# Patient Record
Sex: Female | Born: 1990 | Race: Black or African American | Hispanic: No | Marital: Single | Smoking: Current some day smoker
Health system: Southern US, Community
[De-identification: ages and names within clinical notes are randomized; demographics above are authoritative.]

## PROBLEM LIST (undated history)

## (undated) DIAGNOSIS — Z789 Other specified health status: Secondary | ICD-10-CM

## (undated) DIAGNOSIS — Z3490 Encounter for supervision of normal pregnancy, unspecified, unspecified trimester: Secondary | ICD-10-CM

## (undated) HISTORY — DX: Other specified health status: Z78.9

## (undated) HISTORY — PX: ADENOIDECTOMY: SUR15

---
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
## 2013-11-04 LAB — HM PAP SMEAR

## 2014-10-16 ENCOUNTER — Encounter: Payer: Self-pay | Admitting: Emergency Medicine

## 2014-10-16 ENCOUNTER — Ambulatory Visit
Admission: EM | Admit: 2014-10-16 | Discharge: 2014-10-16 | Disposition: A | Discharge status: Home / Self Care | Payer: 59 | Attending: Internal Medicine | Admitting: Internal Medicine

## 2014-10-16 DIAGNOSIS — B084 Enteroviral vesicular stomatitis with exanthem: Secondary | ICD-10-CM

## 2014-10-16 HISTORY — DX: Encounter for supervision of normal pregnancy, unspecified, unspecified trimester: Z34.90

## 2014-10-16 NOTE — ED Notes (Signed)
Pt has rash on hands and feet, and her 23 year old son just had Hand,foot and mouth.

## 2014-10-16 NOTE — ED Notes (Signed)
Pt is female, need to correct chart. 21/2 -3 months pregnant. LMP July 17, 2014.

## 2014-10-16 NOTE — Discharge Instructions (Signed)
Tylenol  325 mg  Take 2 every 6 hours as needed or alternate with 2 Ibuprofen 200 mg every 6 hours  Zantac 150 mg twice daily for 3-5 days  Zyrtec 10 mg twice daily for 3-5 days  Aveeno [employer] if you wish  Benadryl 25 mg 1 or 2 tablets at bedtime for sleep   Hand, Foot, and Mouth Disease Hand, foot, and mouth disease is a common viral illness. It occurs mainly in children younger than 23 years of age, but adolescents and adults may also get it. This disease is different than foot and mouth disease that cattle, sheep, and pigs get. Most people are better in 1 week. CAUSES  Hand, foot, and mouth disease is usually caused by a group of viruses called enteroviruses. Hand, foot, and mouth disease can spread from person to person (contagious). A person is most contagious during the first week of the illness. It is not transmitted to or from pets or other animals. It is most common in the summer and early fall. Infection is spread from person to person by direct contact with an infected person's:  Nose discharge.  Throat discharge.  Stool. SYMPTOMS  Open sores (ulcers) occur in the mouth. Symptoms may also include:  A rash on the hands and feet, and occasionally the buttocks.  Fever.  Aches.  Pain from the mouth ulcers.  Fussiness. DIAGNOSIS  Hand, foot, and mouth disease is one of many infections that cause mouth sores. To be certain your child has hand, foot, and mouth disease your caregiver will diagnose your child by physical exam.Additional tests are not usually needed. TREATMENT  Nearly all patients recover without medical treatment in 7 to 10 days. There are no common complications. Your child should only take over-the-counter or prescription medicines for pain, discomfort, or fever as directed by your caregiver. Your caregiver may recommend the use of an over-the-counter antacid or a combination of an antacid and diphenhydramine to help coat the lesions in the mouth and  improve symptoms.  HOME CARE INSTRUCTIONS  Try combinations of foods to see what your child will tolerate and aim for a balanced diet. Soft foods may be easier to swallow. The mouth sores from hand, foot, and mouth disease typically hurt and are painful when exposed to salty, spicy, or acidic food or drinks.  Milk and cold drinks are soothing for some patients. Milk shakes, frozen ice pops, slushies, and sherberts are usually well tolerated.  Sport drinks are good choices for hydration, and they also provide a few calories. Often, a child with hand, foot, and mouth disease will be able to drink without discomfort.   For younger children and infants, feeding with a cup, spoon, or syringe may be less painful than drinking through the nipple of a bottle.  Keep children out of childcare programs, schools, or other group settings during the first few days of the illness or until they are without fever. The sores on the body are not contagious. SEEK IMMEDIATE MEDICAL CARE IF:  Your child develops signs of dehydration such as:  Decreased urination.  Dry mouth, tongue, or lips.  Decreased tears or sunken eyes.  Dry skin.  Rapid breathing.  Fussy behavior.  Poor color or pale skin.  Fingertips taking longer than 2 seconds to turn pink after a gentle squeeze.  Rapid weight loss.  Your child does not have adequate pain relief.  Your child develops a severe headache, stiff neck, or change in behavior.  Your child  develops ulcers or blisters that occur on the lips or outside of the mouth. Document Released: 12/07/2002 Document Revised: 06/02/2011 Document Reviewed: 08/22/2010 [facility] Patient Information 2015 [place], [place]. This information is not intended to replace advice given to you by your health care provider. Make sure you discuss any questions you have with your health care provider.

## 2014-10-19 ENCOUNTER — Emergency Department: Payer: 59

## 2014-10-19 ENCOUNTER — Emergency Department
Admission: EM | Admit: 2014-10-19 | Discharge: 2014-10-19 | Disposition: A | Discharge status: Home / Self Care | Payer: 59 | Attending: Emergency Medicine | Admitting: Emergency Medicine

## 2014-10-19 ENCOUNTER — Encounter: Payer: Self-pay | Admitting: Physician Assistant

## 2014-10-19 DIAGNOSIS — O200 Threatened abortion: Secondary | ICD-10-CM | POA: Insufficient documentation

## 2014-10-19 DIAGNOSIS — Z87891 Personal history of nicotine dependence: Secondary | ICD-10-CM | POA: Insufficient documentation

## 2014-10-19 DIAGNOSIS — N939 Abnormal uterine and vaginal bleeding, unspecified: Secondary | ICD-10-CM

## 2014-10-19 DIAGNOSIS — O209 Hemorrhage in early pregnancy, unspecified: Secondary | ICD-10-CM | POA: Diagnosis present

## 2014-10-19 DIAGNOSIS — Z3A12 12 weeks gestation of pregnancy: Secondary | ICD-10-CM | POA: Insufficient documentation

## 2014-10-19 LAB — CBC
HCT: 41 % (ref 35.0–47.0)
HEMOGLOBIN: 13.7 g/dL (ref 12.0–16.0)
MCH: 32.9 pg (ref 26.0–34.0)
MCHC: 33.5 g/dL (ref 32.0–36.0)
MCV: 98.1 fL (ref 80.0–100.0)
Platelets: 177 10*3/uL (ref 150–440)
RBC: 4.18 MIL/uL (ref 3.80–5.20)
RDW: 13.5 % (ref 11.5–14.5)
WBC: 13.3 10*3/uL — ABNORMAL HIGH (ref 3.6–11.0)

## 2014-10-19 LAB — BASIC METABOLIC PANEL
ANION GAP: 10 (ref 5–15)
BUN: 9 mg/dL (ref 6–20)
CHLORIDE: 105 mmol/L (ref 101–111)
CO2: 22 mmol/L (ref 22–32)
Calcium: 8.7 mg/dL — ABNORMAL LOW (ref 8.9–10.3)
Creatinine, Ser: 0.58 mg/dL (ref 0.44–1.00)
GFR calc Af Amer: >60 mL/min (ref >60)
GLUCOSE: 98 mg/dL (ref 65–99)
Potassium: 3.4 mmol/L — ABNORMAL LOW (ref 3.5–5.1)
Sodium: 137 mmol/L (ref 135–145)

## 2014-10-19 LAB — HCG, QUANTITATIVE, PREGNANCY: HCG, BETA CHAIN, QUANT, S: 6214 m[IU]/mL — AB (ref <5)

## 2014-10-19 NOTE — ED Notes (Signed)
Patient is about 10-[redacted] weeks pregnant per her guess. Began to spot this AM and bleeding increased this afternoon. Abdominal cramping began yesterday afternoon. This is her third pregnancy, had abortion with second pregnancy. Live birth with the first.

## 2014-10-19 NOTE — ED Provider Notes (Signed)
[facility] Emergency Department Provider Note    ____________________________________________  Time seen: 1930  I have reviewed the triage vital signs and the nursing notes.   HISTORY  Chief Complaint Vaginal Bleeding   History limited by: Not Limited   HPI Julie Harvey is a 24 y.o. female who presents to the emergency department today because of concerns for vaginal bleeding and pelvic pain. Patient states she thinks she is roughly 10-[redacted] weeks pregnant. She has not seen an OB/GYN doctor yet. She states she started having some pelvic pain yesterday. Today she noticed increasing pelvic pain as well as bloody discharge. She does feel like she passed at least one clot this afternoon. The patient denies any fevers, nausea or vomiting.     Past Medical History  Diagnosis Date  . Pregnancy     There are no active problems to display for this patient.   Past Surgical History  Procedure Laterality Date  . Adenoidectomy      No current outpatient prescriptions on file.  Allergies Review of patient's allergies indicates no known allergies.  No family history on file.  Social History History  Substance Use Topics  . Smoking status: Former Games developer  . Smokeless tobacco: Not on file  . Alcohol Use: No    Review of Systems  Constitutional: Negative for fever. Cardiovascular: Negative for chest pain. Respiratory: Negative for shortness of breath. Gastrointestinal: Positive for pelvic pain Genitourinary: Negative for dysuria. Musculoskeletal: Negative for back pain. Skin: Negative for rash. Neurological: Negative for headaches, focal weakness or numbness.   10-point ROS otherwise negative.  ____________________________________________   PHYSICAL EXAM:  VITAL SIGNS: ED Triage Vitals  Enc Vitals Group     BP 10/19/14 1748 106/71 mmHg     Pulse Rate 10/19/14 1748 104     Resp 10/19/14 1748 18     Temp 10/19/14 1748 98.7 F (37.1 C)      Temp Source 10/19/14 1748 Oral     SpO2 10/19/14 1748 100 %     Weight 10/19/14 1748 137 lb (62.143 kg)     Height 10/19/14 1748  (1.6 m)     Head Cir --      Peak Flow --      Pain Score 10/19/14 1748 6   Constitutional: Alert and oriented. Well appearing and in no distress. Eyes: Conjunctivae are normal. PERRL. Normal extraocular movements. ENT   Head: Normocephalic and atraumatic.   Nose: No congestion/rhinnorhea.   Mouth/Throat: Mucous membranes are moist.   Neck: No stridor. Hematological/Lymphatic/Immunilogical: No cervical lymphadenopathy. Cardiovascular: Normal rate, regular rhythm.  No murmurs, rubs, or gallops. Respiratory: Normal respiratory effort without tachypnea nor retractions. Breath sounds are clear and equal bilaterally. No wheezes/rales/rhonchi. Gastrointestinal: Soft and nontender. No distention. There is no CVA tenderness. Genitourinary: Deferred Musculoskeletal: Normal range of motion in all extremities. No joint effusions.  No lower extremity tenderness nor edema. Neurologic:  Normal speech and language. No gross focal neurologic deficits are appreciated. Speech is normal.  Skin:  Skin is warm, dry and intact. No rash noted. Psychiatric: Mood and affect are normal. Speech and behavior are normal. Patient exhibits appropriate insight and judgment.  ____________________________________________    LABS (pertinent positives/negatives)  Labs Reviewed  HCG, QUANTITATIVE, PREGNANCY - Abnormal; Notable for the following:    hCG, Beta Chain, Quant, S 6214 (*)    All other components within normal limits  CBC - Abnormal; Notable for the following:    WBC 13.3 (*)  All other components within normal limits  BASIC METABOLIC PANEL - Abnormal; Notable for the following:    Potassium 3.4 (*)    Calcium 8.7 (*)    All other components within normal limits  ABO/RH      ____________________________________________   EKG  None  ____________________________________________    RADIOLOGY  Transvaginal ultrasound IMPRESSION: Complex irregular fluid collection in the endometrial cavity which could represent a gestational sac but there is no yolk sac or fetal pole. ____________________________________________   PROCEDURES  Procedure(s) performed: None  Critical Care performed: No  ____________________________________________   INITIAL IMPRESSION / ASSESSMENT AND PLAN / ED COURSE  Pertinent labs & imaging results that were available during my care of the patient were reviewed by me and considered in my medical decision making (see chart for details).  Patient presents to the emergency department with concerns for first trimester vaginal bleeding. Ultrasound shows a complex fluid collection in the uterus. I did discuss with Dr. Vergie Living with OB/GYN who recommended the patient call the clinic tomorrow morning. I discussed with patient importance of following up with Ob/gyn and prenatal vitamins. Additionally the patient did decline pelvic exam and chose to defer it til she can see Ob/gyn.  ____________________________________________   FINAL CLINICAL IMPRESSION(S) / ED DIAGNOSES  Final diagnoses:  Vaginal bleeding  Threatened abortion     Phineas Semen, MD 10/19/14 [phone]

## 2014-10-19 NOTE — Discharge Instructions (Signed)
As discussed you will need a recheck of your blood work (beta HCG) in 48 hours. Additionally it is very important that you follow up with the ob/gyn doctor. Please seek medical attention for any high fevers, chest pain, shortness of breath, change in behavior, persistent vomiting, bloody stool or any other new or concerning symptoms.  Threatened Miscarriage A threatened miscarriage occurs when you have vaginal bleeding during your first 20 weeks of pregnancy but the pregnancy has not ended. If you have vaginal bleeding during this time, your health care provider will do tests to make sure you are still pregnant. If the tests show you are still pregnant and the developing baby (fetus) inside your womb (uterus) is still growing, your condition is considered a threatened miscarriage. A threatened miscarriage does not mean your pregnancy will end, but it does increase the risk of losing your pregnancy (complete miscarriage). CAUSES  The cause of a threatened miscarriage is usually not known. If you go on to have a complete miscarriage, the most common cause is an abnormal number of chromosomes in the developing baby. Chromosomes are the structures inside cells that hold all your genetic material. Some causes of vaginal bleeding that do not result in miscarriage include:  Having sex.  Having an infection.  Normal hormone changes of pregnancy.  Bleeding that occurs when an egg implants in your uterus. RISK FACTORS Risk factors for bleeding in early pregnancy include:  Obesity.  Smoking.  Drinking excessive amounts of alcohol or caffeine.  Recreational drug use. SIGNS AND SYMPTOMS  Light vaginal bleeding.  Mild abdominal pain or cramps. DIAGNOSIS  If you have bleeding with or without abdominal pain before 20 weeks of pregnancy, your health care provider will do tests to check whether you are still pregnant. One important test involves using sound waves and a computer (ultrasound) to create  images of the inside of your uterus. Other tests include an internal exam of your vagina and uterus (pelvic exam) and measurement of your baby's heart rate.  You may be diagnosed with a threatened miscarriage if:  Ultrasound testing shows you are still pregnant.  Your baby's heart rate is strong.  A pelvic exam shows that the opening between your uterus and your vagina (cervix) is closed.  Your heart rate and blood pressure are stable.  Blood tests confirm you are still pregnant. TREATMENT  No treatments have been shown to prevent a threatened miscarriage from going on to a complete miscarriage. However, the right home care is important.  HOME CARE INSTRUCTIONS   Make sure you keep all your appointments for prenatal care. This is very important.  Get plenty of rest.  Do not have sex or use tampons if you have vaginal bleeding.  Do not douche.  Do not smoke or use recreational drugs.  Do not drink alcohol.  Avoid caffeine. SEEK MEDICAL CARE IF:  You have light vaginal bleeding or spotting while pregnant.  You have abdominal pain or cramping.  You have a fever. SEEK IMMEDIATE MEDICAL CARE IF:  You have heavy vaginal bleeding.  You have blood clots coming from your vagina.  You have severe low back pain or abdominal cramps.  You have fever, chills, and severe abdominal pain. MAKE SURE YOU:  Understand these instructions.  Will watch your condition.  Will get help right away if you are not doing well or get worse. Document Released: 03/10/2005 Document Revised: 03/15/2013 Document Reviewed: 01/04/2013 [facility] Patient Information 2015 [place], [place]. This information is not intended  to replace advice given to you by your health care provider. Make sure you discuss any questions you have with your health care provider. ° °

## 2014-10-19 NOTE — ED Notes (Signed)
Pt c/o lower abdominal cramping and light vaginal bleeding. Pt approx [redacted] weeks pregnant. Pt has not been to OBGYN for appt yet. Pt alert and oriented X4, active, cooperative, pt in NAD. RR even and unlabored, color WNL.

## 2014-10-19 NOTE — ED Provider Notes (Signed)
CSN: [id number]     Arrival date & time 10/16/14  1136 History   First MD Initiated Contact with Patient 10/16/14 1311     Chief Complaint  Patient presents with  . Rash   (Consider location/radiation/quality/duration/timing/severity/associated sxs/prior Treatment) HPI 24 yo F with mild malaise. New onset palm and sole rash/blisters. No oral lesions reported. Young 2 yo son had Hand/foot /mouth diagnosed a week ago, he had significant oral lesions as well as hand/foot by her report-his symptoms have resolved. She is having itching making it difficult to rest- reports LMP 4/25 and about 8-10 week preg - has OB appointment later this week. Has used Benadryl At bedtime to sleep. Feels tired.  Past Medical History  Diagnosis Date  . Pregnancy    Past Surgical History  Procedure Laterality Date  . Adenoidectomy     History reviewed. No pertinent family history. History  Substance Use Topics  . Smoking status: Former Games developer  . Smokeless tobacco: Not on file  . Alcohol Use: No   OB History    No data available     Review of Systems Constitutional -afebrile Eyes-denies visual changes ENT- normal voice,denies sore throat, no oral lesions CV-denies chest pain Resp-denies SOB GI- negative for nausea,vomiting, diarrhea GU- negative for dysuria MSK- negative for back pain, ambulatory Skin- see HPI, has new onset red rash/blisters hands and feet, Neuro- negative headache,focal weakness or numbness    Allergies  Review of patient's allergies indicates no known allergies.  Home Medications   Prior to Admission medications   Not on File   BP 101/56 mmHg  Pulse 94  Temp(Src) 98.2 F (36.8 C) (Oral)  Resp 16  Ht  (1.6 m) Physical Exam   Constitutional -alert and oriented,well appearing and in mild distress rash, mild fatigue Head-atraumatic, normocephalic Eyes- conjunctiva normal, EOMI ,conjugate gaze Nose- no congestion or rhinorrhea Mouth/throat- mucous membranes  moist ,oropharynx non-erythematous- no blisters Neck- supple without glandular enlargement CV- regular rate, grossly normal heart sounds,  Resp-no distress, normal respiratory effort,clear to auscultation bilaterally GI- soft,non-tender,no distention GU-  Not examined MSK- no tender, normal ROM, all extremities, ambulatory, self-care Neuro- normal speech and language, no gross focal neurological deficit appreciated, no gait instability, Skin-palms and soles present with outbreak individual erythematous  lesions, some islands of same- pruritis reported by patient, some blisters Psych-mood and affect grossly normal; speech and behavior grossly normal ED Course  Procedures (including critical care time) Labs Review Labs Reviewed - No data to display  Imaging Review No results found.   MDM   1. Hand, foot and mouth disease    Diagnosis and treatment discussed. Cautioned her to use a minimum of medication given her early pregnancy status and to discuss with Ob-Gyn at her pending follow up .Aveeno Oatmeal Colloid Bath if desired. Encouraged to increase her fluid intake. Questions fielded, expectations and recommendations reviewed. Patient expresses understanding. [facility] to [facility] with questions, concern or exacerbation.     Rae Halsted, PA-C 10/19/14 1335

## 2014-10-20 LAB — ABO/RH: ABO/RH(D): O POS

## 2014-10-21 LAB — ABO/RH: ABO/RH(D): O POS

## 2014-10-26 ENCOUNTER — Encounter
Admission: RE | Disposition: A | Discharge status: Home / Self Care | Payer: Self-pay | Source: Ambulatory Visit | Attending: Obstetrics and Gynecology

## 2014-10-26 ENCOUNTER — Ambulatory Visit: Payer: 59 | Admitting: Certified Registered Nurse Anesthetist

## 2014-10-26 ENCOUNTER — Ambulatory Visit
Admission: RE | Admit: 2014-10-26 | Discharge: 2014-10-26 | Disposition: A | Discharge status: Home / Self Care | Payer: 59 | Source: Ambulatory Visit | Attending: Obstetrics and Gynecology | Admitting: Obstetrics and Gynecology

## 2014-10-26 ENCOUNTER — Encounter: Payer: Self-pay | Admitting: *Deleted

## 2014-10-26 DIAGNOSIS — O034 Incomplete spontaneous abortion without complication: Secondary | ICD-10-CM | POA: Diagnosis present

## 2014-10-26 DIAGNOSIS — N9972 Accidental puncture and laceration of a genitourinary system organ or structure during other procedure: Secondary | ICD-10-CM | POA: Insufficient documentation

## 2014-10-26 DIAGNOSIS — Y92234 Operating room of hospital as the place of occurrence of the external cause: Secondary | ICD-10-CM | POA: Insufficient documentation

## 2014-10-26 DIAGNOSIS — Y838 Other surgical procedures as the cause of abnormal reaction of the patient, or of later complication, without mention of misadventure at the time of the procedure: Secondary | ICD-10-CM | POA: Insufficient documentation

## 2014-10-26 DIAGNOSIS — Z87891 Personal history of nicotine dependence: Secondary | ICD-10-CM | POA: Insufficient documentation

## 2014-10-26 HISTORY — PX: DILATION AND EVACUATION: SHX1459

## 2014-10-26 LAB — TYPE AND SCREEN
ABO/RH(D): O POS
Antibody Screen: NEGATIVE

## 2014-10-26 LAB — CBC WITH DIFFERENTIAL/PLATELET
Basophils Absolute: 0.1 10*3/uL (ref 0–0.1)
Basophils Relative: 1 %
EOS ABS: 0.1 10*3/uL (ref 0–0.7)
Eosinophils Relative: 1 %
HCT: 32.2 % — ABNORMAL LOW (ref 35.0–47.0)
HEMOGLOBIN: 10.5 g/dL — AB (ref 12.0–16.0)
LYMPHS ABS: 1.8 10*3/uL (ref 1.0–3.6)
LYMPHS PCT: 15 %
MCH: 32.1 pg (ref 26.0–34.0)
MCHC: 32.7 g/dL (ref 32.0–36.0)
MCV: 98.2 fL (ref 80.0–100.0)
MONO ABS: 0.8 10*3/uL (ref 0.2–0.9)
Monocytes Relative: 7 %
NEUTROS ABS: 9.3 10*3/uL — AB (ref 1.4–6.5)
Neutrophils Relative %: 76 %
Platelets: 154 10*3/uL (ref 150–440)
RBC: 3.27 MIL/uL — AB (ref 3.80–5.20)
RDW: 13 % (ref 11.5–14.5)
WBC: 12.1 10*3/uL — AB (ref 3.6–11.0)

## 2014-10-26 SURGERY — DILATION AND EVACUATION, UTERUS
Anesthesia: Choice

## 2014-10-26 MED ORDER — MIDAZOLAM HCL 2 MG/2ML IJ SOLN
INTRAMUSCULAR | Status: DC | PRN
  Administered 2014-10-26: 2 mg via INTRAVENOUS

## 2014-10-26 MED ORDER — LACTATED RINGERS IV SOLN
INTRAVENOUS | Status: DC
  Administered 2014-10-26 (×2): via INTRAVENOUS

## 2014-10-26 MED ORDER — LIDOCAINE HCL (CARDIAC) 20 MG/ML IV SOLN
INTRAVENOUS | Status: DC | PRN
  Administered 2014-10-26: 100 mg via INTRAVENOUS

## 2014-10-26 MED ORDER — GLYCOPYRROLATE 0.2 MG/ML IJ SOLN
INTRAMUSCULAR | Status: DC | PRN
  Administered 2014-10-26: 0.2 mg via INTRAVENOUS

## 2014-10-26 MED ORDER — HYDROMORPHONE HCL 1 MG/ML IJ SOLN
INTRAMUSCULAR | Status: DC | PRN
  Administered 2014-10-26: 1 mg via INTRAVENOUS

## 2014-10-26 MED ORDER — FENTANYL CITRATE (PF) 100 MCG/2ML IJ SOLN: 25.0000 ug | INTRAMUSCULAR | Status: DC | PRN

## 2014-10-26 MED ORDER — FAMOTIDINE 20 MG PO TABS
20.0000 mg | ORAL_TABLET | Freq: Once | ORAL | Status: AC
  Administered 2014-10-26: 20 mg via ORAL

## 2014-10-26 MED ORDER — OXYCODONE-ACETAMINOPHEN 5-325 MG PO TABS
1.0000 | ORAL_TABLET | Freq: Four times a day (QID) | ORAL | Status: DC | PRN
  Administered 2014-10-26: 1 via ORAL

## 2014-10-26 MED ORDER — OXYCODONE-ACETAMINOPHEN 5-325 MG PO TABS: 1.0000 | ORAL_TABLET | Freq: Four times a day (QID) | ORAL | Status: DC | PRN

## 2014-10-26 MED ORDER — LIDOCAINE HCL (PF) 1 % IJ SOLN
INTRAMUSCULAR | Status: AC
  Filled 2014-10-26: qty 30

## 2014-10-26 MED ORDER — METHYLERGONOVINE MALEATE 0.2 MG PO TABS: 0.2000 mg | ORAL_TABLET | Freq: Four times a day (QID) | ORAL | Status: AC

## 2014-10-26 MED ORDER — PHENYLEPHRINE HCL 10 MG/ML IJ SOLN
INTRAMUSCULAR | Status: DC | PRN
  Administered 2014-10-26: 100 ug via INTRAVENOUS

## 2014-10-26 MED ORDER — EPHEDRINE SULFATE 50 MG/ML IJ SOLN
INTRAMUSCULAR | Status: DC | PRN
  Administered 2014-10-26: 10 mg via INTRAVENOUS

## 2014-10-26 MED ORDER — LIDOCAINE HCL 1 % IJ SOLN
INTRAMUSCULAR | Status: DC | PRN
  Administered 2014-10-26: 20 mL

## 2014-10-26 MED ORDER — DOXYCYCLINE HYCLATE 100 MG IV SOLR
100.0000 mg | Freq: Once | INTRAVENOUS | Status: AC
  Administered 2014-10-26 (×2): 100 mg via INTRAVENOUS
  Filled 2014-10-26 (×2): qty 100

## 2014-10-26 MED ORDER — ONDANSETRON HCL 4 MG/2ML IJ SOLN
INTRAMUSCULAR | Status: DC | PRN
  Administered 2014-10-26: 4 mg via INTRAVENOUS

## 2014-10-26 MED ORDER — DOCUSATE SODIUM 100 MG PO CAPS: 100.0000 mg | ORAL_CAPSULE | Freq: Two times a day (BID) | ORAL | Status: DC

## 2014-10-26 MED ORDER — OXYCODONE-ACETAMINOPHEN 5-325 MG PO TABS
ORAL_TABLET | ORAL | Status: AC
  Filled 2014-10-26: qty 1

## 2014-10-26 MED ORDER — FAMOTIDINE 20 MG PO TABS
ORAL_TABLET | ORAL | Status: AC
  Administered 2014-10-26: 20 mg via ORAL
  Filled 2014-10-26: qty 1

## 2014-10-26 MED ORDER — PROPOFOL 10 MG/ML IV BOLUS
INTRAVENOUS | Status: DC | PRN
  Administered 2014-10-26: 150 mg via INTRAVENOUS

## 2014-10-26 MED ORDER — DEXAMETHASONE SODIUM PHOSPHATE 4 MG/ML IJ SOLN
INTRAMUSCULAR | Status: DC | PRN
  Administered 2014-10-26: 5 mg via INTRAVENOUS

## 2014-10-26 MED ORDER — ONDANSETRON HCL 4 MG/2ML IJ SOLN: 4.0000 mg | Freq: Once | INTRAMUSCULAR | Status: DC | PRN

## 2014-10-26 MED ORDER — FERROUS GLUCONATE 324 (38 FE) MG PO TABS: 324.0000 mg | ORAL_TABLET | Freq: Two times a day (BID) | ORAL | Status: DC

## 2014-10-26 MED ORDER — LIDOCAINE HCL 2 % IJ SOLN: 0.1000 mL | Freq: Once | INTRAMUSCULAR | Status: DC

## 2014-10-26 MED ORDER — FENTANYL CITRATE (PF) 100 MCG/2ML IJ SOLN
INTRAMUSCULAR | Status: DC | PRN
  Administered 2014-10-26: 100 ug via INTRAVENOUS

## 2014-10-26 SURGICAL SUPPLY — 26 items
BLANKET WARM FULL BOD BAIR (MISCELLANEOUS) ×3 IMPLANT
CATH ROBINSON RED A/P 16FR (CATHETERS) ×3 IMPLANT
CUP MEDICINE 2OZ PLAST GRAD ST (MISCELLANEOUS) ×6 IMPLANT
DRSG TELFA 3X8 NADH (GAUZE/BANDAGES/DRESSINGS) ×6 IMPLANT
GLOVE BIO SURGEON STRL SZ7 (GLOVE) ×9 IMPLANT
GLOVE BIO SURGEON STRL SZ8 (GLOVE) ×3 IMPLANT
GOWN STRL REUS W/ TWL LRG LVL3 (GOWN DISPOSABLE) ×1 IMPLANT
GOWN STRL REUS W/ TWL XL LVL3 (GOWN DISPOSABLE) ×1 IMPLANT
GOWN STRL REUS W/TWL LRG LVL3 (GOWN DISPOSABLE) ×2
GOWN STRL REUS W/TWL XL LVL3 (GOWN DISPOSABLE) ×2
KIT BERKELEY 1ST TRIMESTER 3/8 (MISCELLANEOUS) ×3 IMPLANT
KIT RM TURNOVER CYSTO AR (KITS) ×3 IMPLANT
NEEDLE SPNL 22GX3.5 QUINCKE BK (NEEDLE) ×6 IMPLANT
NS IRRIG 500ML POUR BTL (IV SOLUTION) ×3 IMPLANT
PAD OB MATERNITY 4.3X12.25 (PERSONAL CARE ITEMS) ×3 IMPLANT
PAD PREP 24X41 OB/GYN DISP (PERSONAL CARE ITEMS) ×3 IMPLANT
SET BERKELEY SUCTION TUBING (SUCTIONS) ×3 IMPLANT
SPONGE XRAY 4X4 16PLY STRL (MISCELLANEOUS) ×3 IMPLANT
SUT VIC AB 1 CT1 36 (SUTURE) ×15 IMPLANT
SYR CONTROL 10ML (SYRINGE) ×3 IMPLANT
TOWEL OR 17X26 4PK STRL BLUE (TOWEL DISPOSABLE) ×3 IMPLANT
VACURETTE 10 RIGID CVD (CANNULA) ×3 IMPLANT
VACURETTE 12 RIGID CVD (CANNULA) IMPLANT
VACURETTE 7MM F TIP (CANNULA)
VACURETTE 7MM F TIP STRL (CANNULA) IMPLANT
VACURETTE 8 RIGID CVD (CANNULA) ×3 IMPLANT

## 2014-10-26 NOTE — Op Note (Signed)
Operative Note   10/26/2014  PRE-OP DIAGNOSIS: incomplete abortion   POST-OP DIAGNOSIS: Same. Cervical laceration   SURGEON: Surgeon(s) and Role:    * Cabot Bing, MD - Primary  ASSISTANT: none  PROCEDURE: suction dilation and curettage, repair of cervical laceration  ANESTHESIA: General and local  ESTIMATED BLOOD LOSS:  DRAINS: I/O foley at the end of the case ( UOP)   TOTAL IV FLUIDS: crystalloid  SPECIMENS: products of conception  VTE PROPHYLAXIS: TED hoses to the bilateral lower extremities  ANTIBIOTICS: Doxycycline  IV x1 pre op  COMPLICATIONS: 3-4 o'clock cervical laceration  DISPOSITION: PACU - hemodynamically stable.  CONDITION: stable  BLOOD TYPE: O positive  FINDINGS: Normal EGBUS, vagina and cervix with 10mL old blood in the vault.  Necrotic appearing POCs seen. Pumping arterial blood vessel seen in the LLQ of the cervix.  PROCEDURE IN DETAIL:  After informed consent was obtained, the patient was taken to the operating room where anesthesia was obtained without difficulty. The patient was positioned in the dorsal lithotomy position in [place] stirrups.   The patient was examined under anesthesia, with the above noted findings.  The bi-valved speculum was placed inside the patient's vagina, and the the anterior lip of the cervix was seen and grasped with the tenaculum.  A paracervical block was achieved with 20mL of 1% lidocaine. The cervix was already dilated and easily passed a #8 suction cannula and the suction D&C was done, after confirming suction of , prior to the start of the case. During the [facility], it was noticed that there was brisk bleeding and a likely cervical laceration in the area of the LLQ of the cervix, after 1-0 vicryl figure of eight sutures were placed at 7-8 and 6 o'clock. Another stitch was placed at 3-4 o'clock, in a figure of eight fashion, at the pumping arterial vessel and hemostasis was noted; during the above  stitches, care was made to incorporate only the ipsilateral cervix and to keep the canal patent.  The suction cannula was then removed from the machine and placed at the uterine fundus and then gently removed from the uterus and cervix with no bleeding noted at any of the levels.  The suction D&C and then manual curettage was done, until gritty texture was noted in all four quadrants, excellent hemostasis seen and the cannula test was again repeated and no bleeding noted at any level, after prophylactic bimanual massage was done. The patient's bladder was catheterized with an in and out foley catheter.  She was then taken out of dorsal lithotomy, the patient tolerated the procedure well, and sponge, lap and needle counts were correct x2.  The patient was taken to recovery room in excellent condition.  Cornelia Copa MD [facility] OBGYN Pager [phone]

## 2014-10-26 NOTE — Anesthesia Procedure Notes (Signed)
Procedure Name: LMA Insertion Date/Time: 10/26/2014 2:23 PM Performed by: Shirlee Limerick, Kynlee Koenigsberg Pre-anesthesia Checklist: Patient identified, Patient being monitored, Timeout performed, Emergency Drugs available and Suction available Patient Re-evaluated:Patient Re-evaluated prior to inductionOxygen Delivery Method: Circle system utilized Preoxygenation: Pre-oxygenation with 100% oxygen Intubation Type: IV induction Ventilation: Mask ventilation without difficulty LMA: LMA inserted LMA Size: 4.0 Tube type: Oral Number of attempts: 1 Placement Confirmation: positive ETCO2 and breath sounds checked- equal and bilateral Tube secured with: Tape Dental Injury: Teeth and Oropharynx as per pre-operative assessment

## 2014-10-26 NOTE — Discharge Instructions (Addendum)
We will discuss your surgery once again in detail at your post-op visit in two to four weeks. If you havent already done so, please call to make your appointment as soon as possible.  [facility]  [address]  [place], [place] [address] [place], [place] [address]  Phone: [phone] Phone: [phone]  Fax: [phone] Fax: [phone]     Dilation and Curettage or Vacuum Curettage, Care After These instructions give you information on caring for yourself after your procedure. Your doctor may also give you more specific instructions. Call your doctor if you have any problems or questions after your procedure. HOME CARE  Do not drive for 24 hours.  Wait 1 week before doing any activities that wear you out  Limit stair climbing to once or twice a day.  Rest often.  Continue with your usual diet.  Drink enough fluids to keep your pee (urine) clear or pale yellow.  If you have a hard time pooping (constipation), you may:  Take a medicine to help you go poop (laxative) as told by your doctor.  Eat more fruit and bran.  Drink more fluids.  Take showers, not baths, for as [place] as told by your doctor.  Do not swim or use a hot tub until your doctor says it is okay.  Have someone with you for 1-2 days after the procedure.  Do not douche, use tampons, or have sex (intercourse) for 2 weeks.  Only take medicines as told by your doctor. Do not take aspirin. It can cause bleeding.  Keep all doctor visits. GET HELP IF:  You have cramps or pain not helped by medicine.  You have new pain in the belly (abdomen).  You have a bad smelling fluid coming from your vagina.  You have a rash.  You have problems with any medicine. GET HELP RIGHT AWAY IF:   You start to bleed more than a regular period.  You have a fever.  You have chest pain.  You have trouble breathing.  You feel dizzy or feel like passing out (fainting).  You pass  out.  You have pain in the tops of your shoulders.  You have vaginal bleeding with or without clumps of blood (blood clots). MAKE SURE YOU:  Understand these instructions.  Will watch your condition.  Will get help right away if you are not doing well or get worse. Document Released: 12/18/2007 Document Revised: 03/15/2013 Document Reviewed: 10/07/2012 [facility] Patient Information 2015 [place], [place]. This information is not intended to replace advice given to you by your health care provider. Make sure you discuss any questions you have with your health care provider.   AMBULATORY SURGERY  DISCHARGE INSTRUCTIONS   1) The drugs that you were given will stay in your system until tomorrow so for the next 24 hours you should not:  A) Drive an automobile B) Make any legal decisions C) Drink any alcoholic beverage   2) You may resume regular meals tomorrow.  Today it is better to start with liquids and gradually work up to solid foods.  You may eat anything you prefer, but it is better to start with liquids, then soup and crackers, and gradually work up to solid foods.   3) Please notify your doctor immediately if you have any unusual bleeding, trouble breathing, redness and pain at the surgery site, drainage, fever, or pain not relieved by medication.    4) Additional Instructions:  Please contact your physician  with any problems or Same Day Surgery at [phone], Monday through Friday 6 am to 4 pm, or [place] at [facility] number at [phone].

## 2014-10-26 NOTE — Transfer of Care (Signed)
Immediate Anesthesia Transfer of Care Note  Patient: Julie Harvey  Procedure(s) Performed: Procedure(s): DILATATION AND EVACUATION (N/A)  Patient Location: PACU  Anesthesia Type:General  Level of Consciousness: sedated  Airway & Oxygen Therapy: Patient Spontanous Breathing and Patient connected to nasal cannula oxygen  Post-op Assessment: Report given to RN and Post -op Vital signs reviewed and stable  Post vital signs: Reviewed and stable  Last Vitals:  Filed Vitals:   10/26/14 1537  BP: 115/51  Pulse:   Temp: 36.1 C  Resp: 17    Complications: No apparent anesthesia complications

## 2014-10-26 NOTE — Anesthesia Preprocedure Evaluation (Addendum)
Anesthesia Evaluation  Patient identified by MRN, date of birth, ID band Patient awake    Reviewed: Allergy & Precautions, H&P , NPO status , Patient's Chart, lab work & pertinent test results, reviewed documented beta blocker date and time   Airway Mallampati: II  TM Distance: >3 FB Neck ROM: full    Dental   Pulmonary Current Smoker, former smoker,          Cardiovascular Rate:Normal     Neuro/Psych    GI/Hepatic   Endo/Other    Renal/GU      Musculoskeletal   Abdominal   Peds  Hematology   Anesthesia Other Findings   Reproductive/Obstetrics                             Anesthesia Physical Anesthesia Plan  ASA: II  Anesthesia Plan: General ETT   Post-op Pain Management:    Induction:   Airway Management Planned:   Additional Equipment:   Intra-op Plan:   Post-operative Plan:   Informed Consent: I have reviewed the patients History and Physical, chart, labs and discussed the procedure including the risks, benefits and alternatives for the proposed anesthesia with the patient or authorized representative who has indicated his/her understanding and acceptance.     Plan Discussed with: CRNA  Anesthesia Plan Comments:         Anesthesia Quick Evaluation  

## 2014-10-26 NOTE — H&P (Signed)
GYN H&P  No changes from office H&P. Still having some bleeding but no passage of tissue. Will proceed with suction D&C for incomplete abortion when OR is ready  Cornelia Copa MD [facility]  Pager: [phone]

## 2014-10-27 ENCOUNTER — Encounter: Payer: Self-pay | Admitting: Obstetrics and Gynecology

## 2014-10-27 NOTE — Anesthesia Postprocedure Evaluation (Signed)
  Anesthesia Post-op Note  Patient: Julie Harvey  Procedure(s) Performed: Procedure(s): DILATATION AND EVACUATION (N/A)  Anesthesia type:No value filed.  Patient location: PACU  Post pain: Pain level controlled  Post assessment: Post-op Vital signs reviewed, Patient's Cardiovascular Status Stable, Respiratory Function Stable, Patent Airway and No signs of Nausea or vomiting  Post vital signs: Reviewed and stable  Last Vitals:  Filed Vitals:   10/26/14 1705  BP: 105/64  Pulse: 97  Temp:   Resp: 16    Level of consciousness: awake, alert  and patient cooperative  Complications: No apparent anesthesia complications

## 2014-10-30 LAB — SURGICAL PATHOLOGY

## 2015-05-08 LAB — HM PAP SMEAR: HM Pap smear: POSITIVE

## 2015-10-26 IMAGING — US US OB TRANSVAGINAL
1 series · 14 of 28 positions shown · non-contrast
Comparison: None.

CLINICAL DATA: Pelvic pain.  Vaginal bleeding.

EXAM:
OBSTETRIC <14 WK US AND TRANSVAGINAL OB US
TECHNIQUE: Both transabdominal and transvaginal ultrasound examinations were
performed for complete evaluation of the gestation as well as the
maternal uterus, adnexal regions, and pelvic cul-de-sac.
Transvaginal technique was performed to assess early pregnancy.

[Series 1: us ob transvaginal · 0.22mm/px · 14 of 99 slices shown]
[im 4/99]
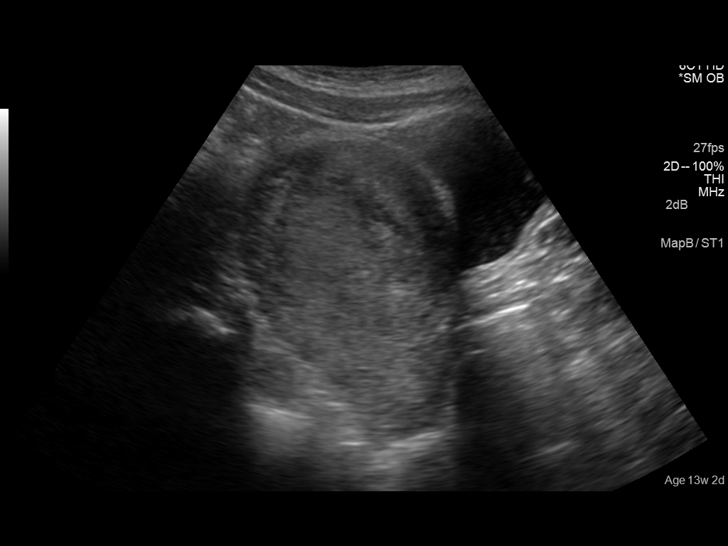
[im 11/99]
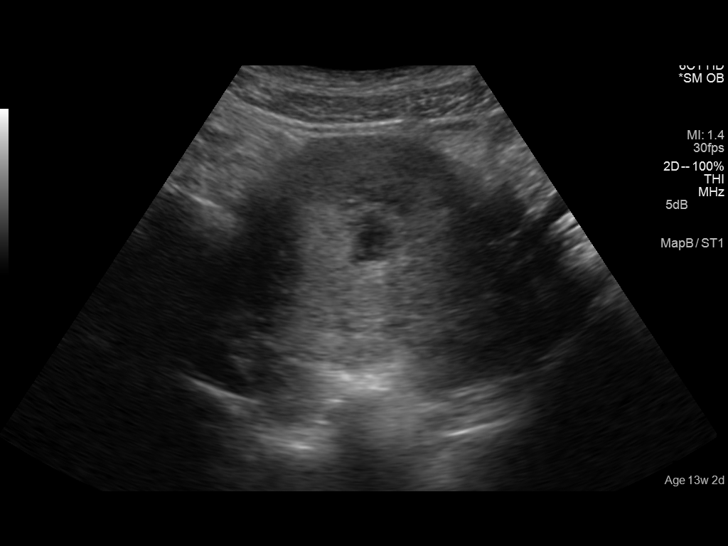
[im 19/99]
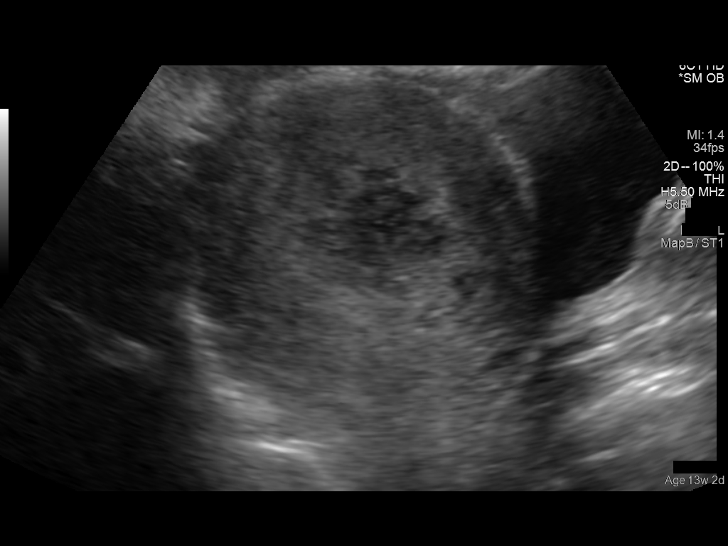
[im 26/99]
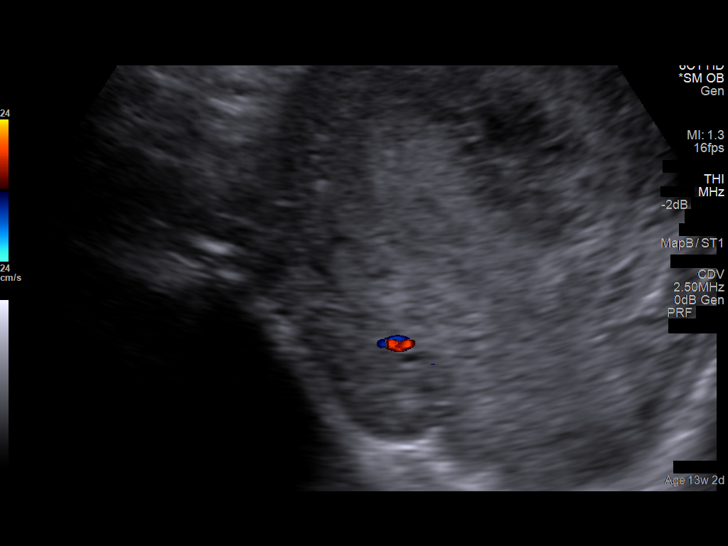
[im 33/99]
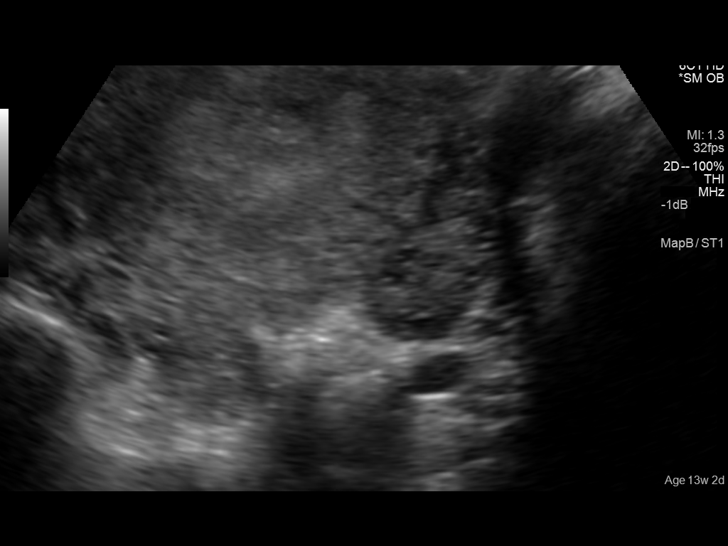
[im 40/99]
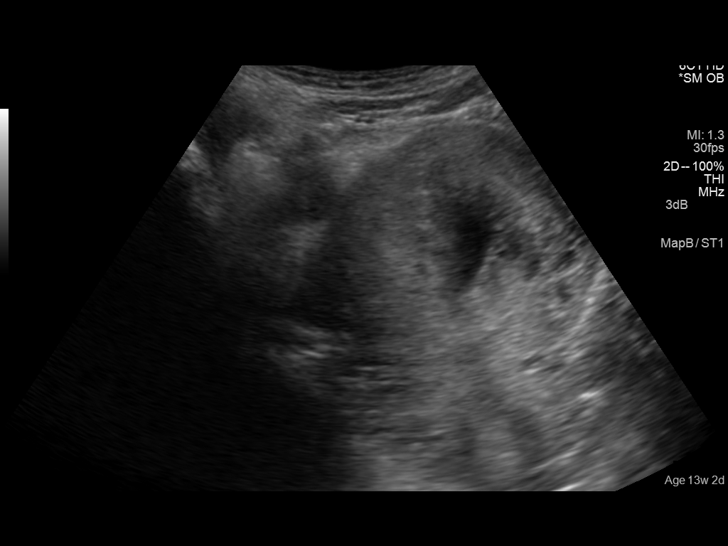
[im 48/99]
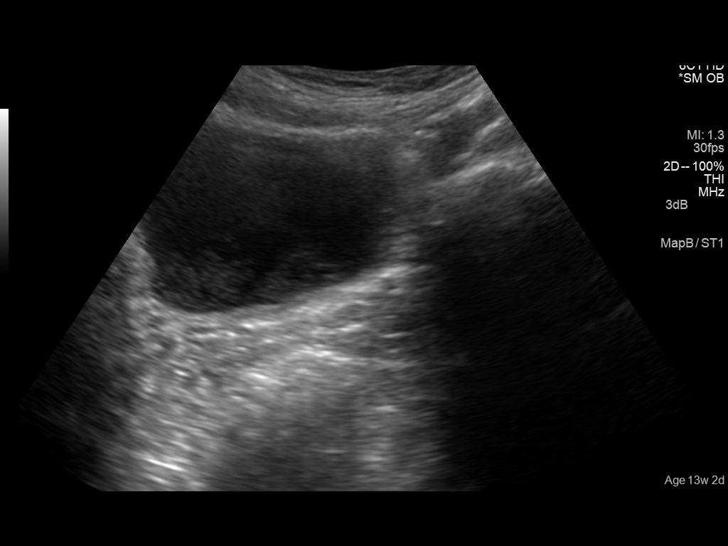
[im 55/99]
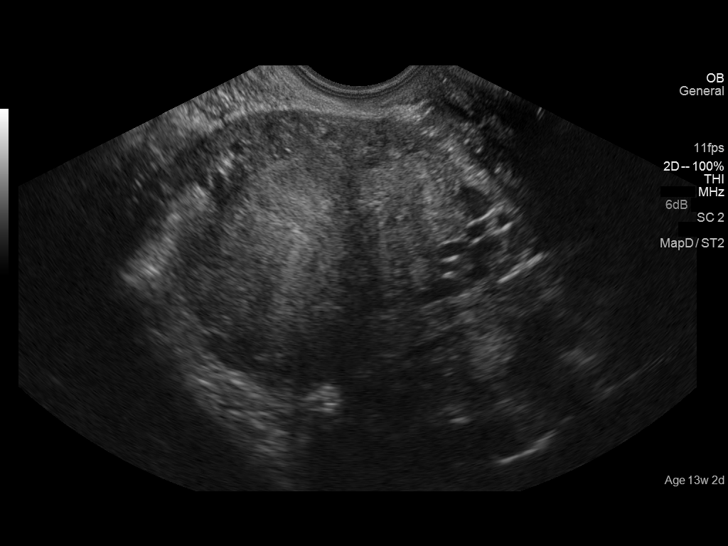
[im 62/99]
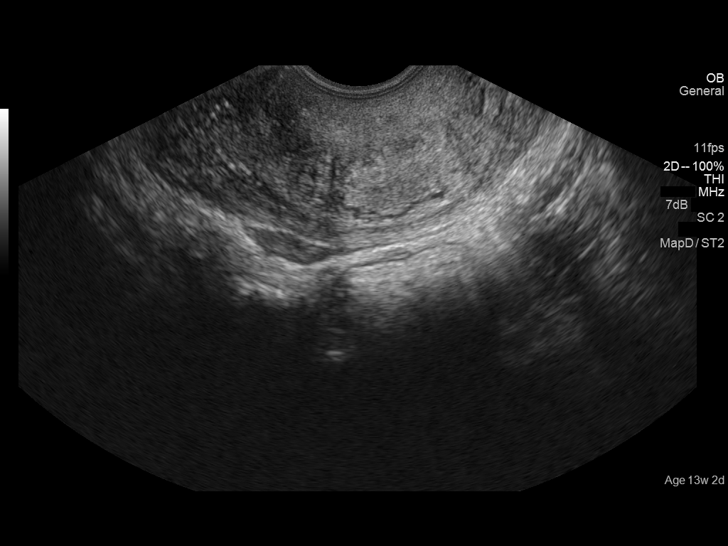
[im 69/99]
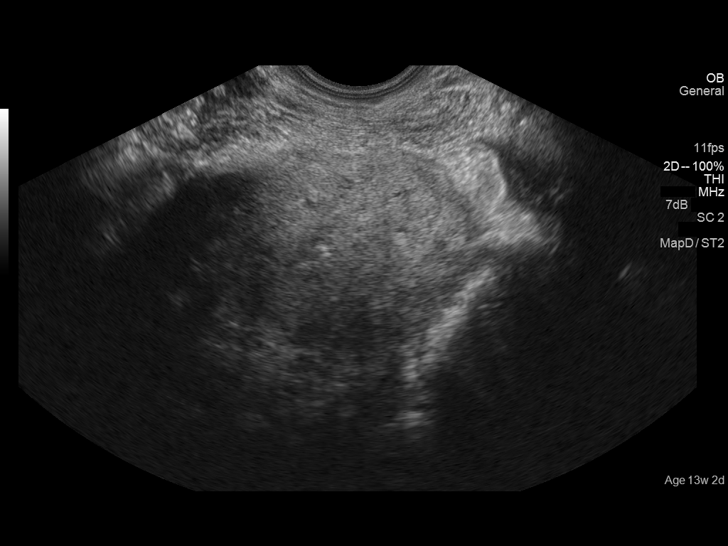
[im 77/99]
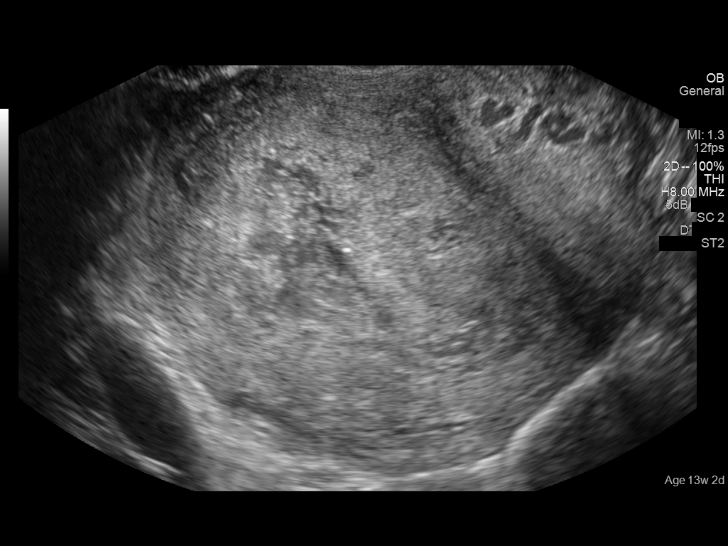
[im 84/99]
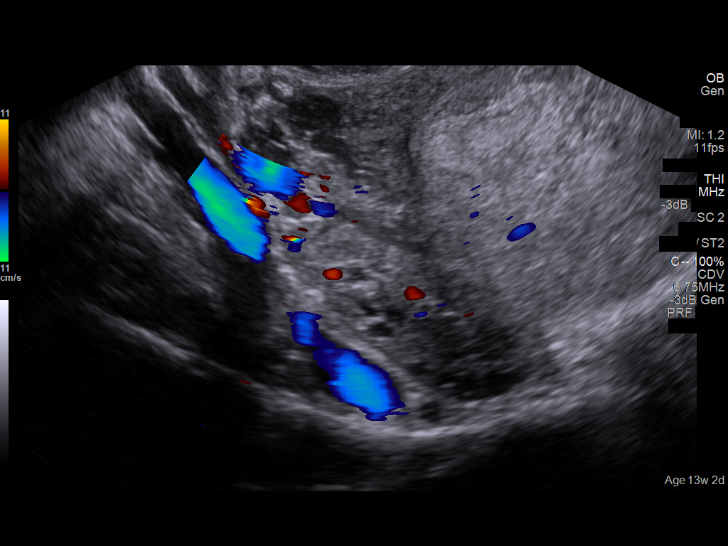
[im 91/99]
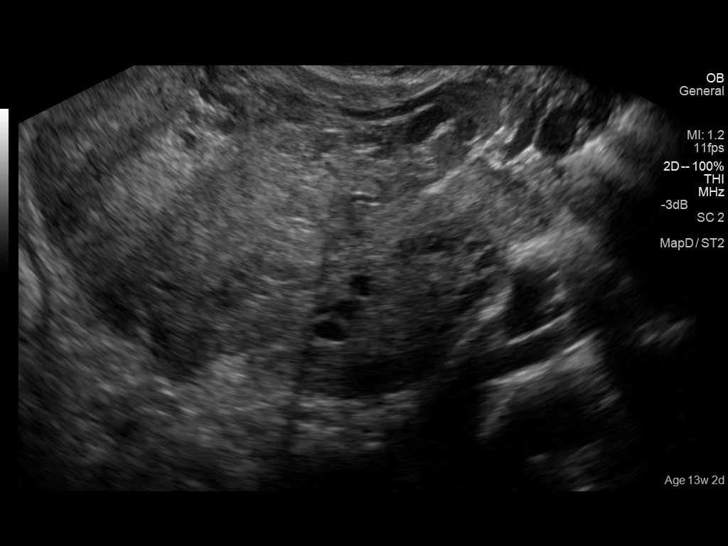
[im 99/99]
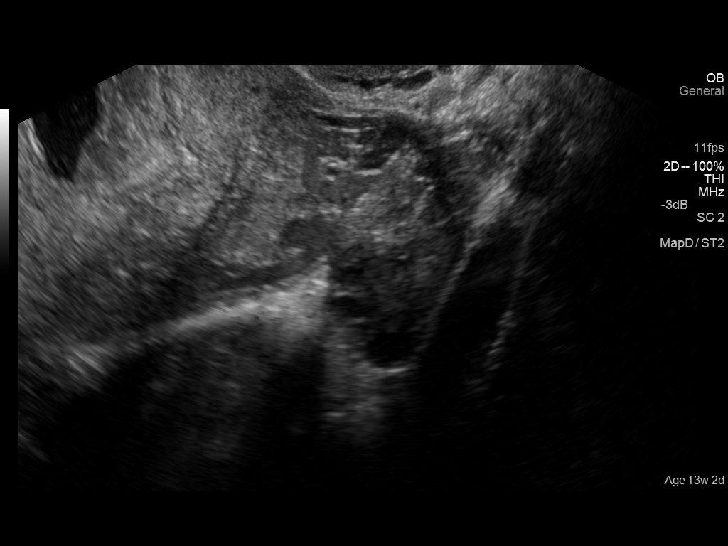

[14 of 28 positions shown; findings below may reference images not displayed]

FINDINGS: Intrauterine gestational sac: There is a complex focal fluid
collection in the endometrial cavity.

Yolk sac:  No

Embryo:  No

Cardiac Activity: No

Heart Rate:   bpm

MSD: 20  mm   6 w   6  d

Maternal uterus/adnexae: No subchorionic hemorrhage. Normal ovaries.
IMPRESSION: Complex irregular fluid collection in the endometrial cavity which
could represent a gestational sac but there is no yolk sac or fetal
pole.

## 2016-09-03 LAB — HM PAP SMEAR: HM Pap smear: NEGATIVE

## 2019-02-09 ENCOUNTER — Other Ambulatory Visit: Payer: Self-pay

## 2019-02-09 ENCOUNTER — Ambulatory Visit (LOCAL_COMMUNITY_HEALTH_CENTER): Payer: Self-pay | Admitting: Family Medicine

## 2019-02-09 ENCOUNTER — Encounter: Payer: Self-pay | Admitting: Family Medicine

## 2019-02-09 VITALS — BP 108/70 | Ht 64.0 in | Wt 142.0 lb

## 2019-02-09 DIAGNOSIS — Z3042 Encounter for surveillance of injectable contraceptive: Secondary | ICD-10-CM

## 2019-02-09 DIAGNOSIS — Z3009 Encounter for other general counseling and advice on contraception: Secondary | ICD-10-CM

## 2019-02-09 DIAGNOSIS — Z30013 Encounter for initial prescription of injectable contraceptive: Secondary | ICD-10-CM

## 2019-02-09 MED ORDER — MEDROXYPROGESTERONE ACETATE 150 MG/ML IM SUSP
150.0000 mg | Freq: Once | INTRAMUSCULAR | Status: AC
  Administered 2019-02-09: 09:00:00 150 mg via INTRAMUSCULAR

## 2019-02-09 NOTE — Progress Notes (Signed)
  Family Planning Visit- Repeat Yearly Visit  Subjective:  Julie Harvey is a 28 y.o. being seen today for an well woman visit and to discuss family planning options.    She is currently using Depo-Provera injections for pregnancy prevention. Patient reports she does not if she or her partner wants a pregnancy in the next year. Patient  does not have a problem list on file.  Chief Complaint  Patient presents with  . Contraception    Depo    Patient reports her last Depo was 10/27/2018 @ [facility] and her last pap was in 2019.  Does the patient desire a pregnancy in the next year? (OKQ flowsheet) No  See flowsheet for other program required questions.   Body mass index is 24.37 kg/m. - Patient is eligible for diabetes screening based on BMI and age >32?  not applicable DJ2E ordered? not applicable  Patient reports 1 of partners in last year. Desires STI screening?  No  Does the patient have a current or past history of drug use? Yes   No components found for: HCV]   Health Maintenance Due  Topic Date Due  . HIV Screening  10/28/2006  . TETANUS/TDAP  10/28/2010  . PAP-Cervical Cytology Screening  10/27/2012  . PAP SMEAR-Modifier  10/27/2012  . INFLUENZA VACCINE  10/23/2018    ROS  The following portions of the patient's history were reviewed and updated as appropriate: allergies, current medications, past family history, past medical history, past social history, past surgical history and problem list. Problem list updated.  Objective:   Vitals:   02/09/19 0817  BP: 108/70  Weight: 142 lb (64.4 kg)  Height: 5\' 4"  (1.626 m)    Physical Exam not indicated    Assessment and Plan:  [name] is a 28 y.o. female presenting to the [facility] Department for an initial well woman exam/family planning visit  Contraception counseling: Reviewed all forms of birth control options available including abstinence; over the counter/barrier methods; hormonal  contraceptive medication including pill, patch, ring, injection,contraceptive implant; hormonal and nonhormonal IUDs; permanent sterilization options including vasectomy and the various tubal sterilization modalities. Risks and benefits reviewed.  Questions were answered.  Written information was also given to the patient to review.  Patient desires Depo, this was prescribed for patient. She will follow up in  11-13 wks for surveillance.  She was told to call with any further questions, or with any concerns about this method of contraception.  Emphasized use of condoms 100% of the time for STI prevention.  1. General counseling and advice on contraceptive management ROI for [facility] records including recent pap and exam.  Client would like to transfer her care to [facility]  2. Surveillance for Depo-Provera contraception - medroxyPROGESTERone (DEPO-PROVERA) injection 150 mg today with 1 RF.  May have rest of year's RX for Depo if the  Pap smear was WNL done in 2019  @ [facility].    No follow-ups on file.  No future appointments.  Hassell Done, FNP

## 2019-02-09 NOTE — Progress Notes (Signed)
Pt received Depo 150mg  IM per provider order and pt tolerated well. ROI for pap and last PE results obtained and signed by pt. ROI faxed and fax confirmation received. Provider orders completed.Ronny Bacon, RN

## 2019-02-09 NOTE — Progress Notes (Signed)
Pt here for Depo. Pt reports she was getting Depo at her doctor at [facility] but the last times she got a Depo was with [facility] but not sure of the exact location she went on 10/27/2018. Based on that date pt is 15 weeks and 0 days post last Depo. Pt reports having issues with getting appts with [facility]. Pt with no issues with the Depo.Ronny Bacon, RN

## 2019-02-14 ENCOUNTER — Encounter: Payer: Self-pay | Admitting: Physician Assistant

## 2019-02-14 NOTE — Progress Notes (Signed)
Per records received from [facility], patient had "annual wellness visit" on 10/12/2017. Last Depo was 10/27/2018.

## 2019-05-24 ENCOUNTER — Other Ambulatory Visit: Payer: Self-pay

## 2019-05-24 ENCOUNTER — Ambulatory Visit (LOCAL_COMMUNITY_HEALTH_CENTER): Payer: Managed Care, Other (non HMO)

## 2019-05-24 VITALS — BP 94/63 | Ht 64.0 in | Wt 139.0 lb

## 2019-05-24 DIAGNOSIS — Z3042 Encounter for surveillance of injectable contraceptive: Secondary | ICD-10-CM

## 2019-05-24 DIAGNOSIS — Z30013 Encounter for initial prescription of injectable contraceptive: Secondary | ICD-10-CM

## 2019-05-24 DIAGNOSIS — Z3009 Encounter for other general counseling and advice on contraception: Secondary | ICD-10-CM | POA: Diagnosis not present

## 2019-05-24 MED ORDER — MEDROXYPROGESTERONE ACETATE 150 MG/ML IM SUSP
150.0000 mg | Freq: Once | INTRAMUSCULAR | Status: AC
  Administered 2019-05-24: 150 mg via INTRAMUSCULAR

## 2019-05-24 NOTE — Progress Notes (Signed)
14.6 weeks post depo today.  DMPA 150 mg IM administered per Larene Pickett, FNP order dated 02/09/2019.  Sadie Haber, [place] reviewed chart: Pap is due June 2021, May 2021 will be a little too early for pap (when next depo is due), pt can come back in June for pap or wait until August 2021 when another depo should be due (if pt stays on depo schedule). Pt counseled about pap recommendations.

## 2019-08-09 ENCOUNTER — Other Ambulatory Visit: Payer: Self-pay | Admitting: Physician Assistant

## 2019-08-09 DIAGNOSIS — Z3042 Encounter for surveillance of injectable contraceptive: Secondary | ICD-10-CM

## 2019-08-09 MED ORDER — MEDROXYPROGESTERONE ACETATE 150 MG/ML IM SUSP
150.0000 mg | Freq: Once | INTRAMUSCULAR | Status: AC
  Administered 2019-08-10: 150 mg via INTRAMUSCULAR

## 2019-08-09 NOTE — Addendum Note (Signed)
Addended by: Sadie Haber on: 08/09/2019 05:40 PM   Modules accepted: Orders

## 2019-08-09 NOTE — Progress Notes (Signed)
Reviewed chart and patient is due for pap.  Provided that patient desires to continue with Depo and BP is normal, ok for Depo on 08/10/2019.  Will need RP with pap with next Depo in August.

## 2019-08-10 ENCOUNTER — Ambulatory Visit (LOCAL_COMMUNITY_HEALTH_CENTER): Payer: Managed Care, Other (non HMO)

## 2019-08-10 ENCOUNTER — Telehealth: Payer: Self-pay | Admitting: Family Medicine

## 2019-08-10 ENCOUNTER — Other Ambulatory Visit: Payer: Self-pay

## 2019-08-10 VITALS — BP 96/67 | Ht 64.0 in | Wt 142.0 lb

## 2019-08-10 DIAGNOSIS — Z30013 Encounter for initial prescription of injectable contraceptive: Secondary | ICD-10-CM

## 2019-08-10 DIAGNOSIS — Z3009 Encounter for other general counseling and advice on contraception: Secondary | ICD-10-CM

## 2019-08-10 NOTE — Telephone Encounter (Signed)
error 

## 2019-08-10 NOTE — Progress Notes (Signed)
Client counseled that physical is due with next Depo injection on 10/26/2019. Depo administered today per one time order written by Sadie Haber PA on 08/09/2019. Client tolerated without complaint. Jossie Ng, RN

## 2019-09-13 ENCOUNTER — Ambulatory Visit (LOCAL_COMMUNITY_HEALTH_CENTER): Payer: Managed Care, Other (non HMO) | Admitting: Family Medicine

## 2019-09-13 ENCOUNTER — Other Ambulatory Visit: Payer: Self-pay

## 2019-09-13 ENCOUNTER — Encounter: Payer: Self-pay | Admitting: Advanced Practice Midwife

## 2019-09-13 VITALS — BP 104/76 | HR 88 | Ht 64.0 in | Wt 143.6 lb

## 2019-09-13 DIAGNOSIS — Z01419 Encounter for gynecological examination (general) (routine) without abnormal findings: Secondary | ICD-10-CM | POA: Diagnosis not present

## 2019-09-13 DIAGNOSIS — Z202 Contact with and (suspected) exposure to infections with a predominantly sexual mode of transmission: Secondary | ICD-10-CM

## 2019-09-13 DIAGNOSIS — Z30013 Encounter for initial prescription of injectable contraceptive: Secondary | ICD-10-CM | POA: Diagnosis not present

## 2019-09-13 DIAGNOSIS — Z3009 Encounter for other general counseling and advice on contraception: Secondary | ICD-10-CM | POA: Diagnosis not present

## 2019-09-13 DIAGNOSIS — Z720 Tobacco use: Secondary | ICD-10-CM

## 2019-09-13 DIAGNOSIS — Z803 Family history of malignant neoplasm of breast: Secondary | ICD-10-CM

## 2019-09-13 DIAGNOSIS — Z113 Encounter for screening for infections with a predominantly sexual mode of transmission: Secondary | ICD-10-CM

## 2019-09-13 DIAGNOSIS — Z3042 Encounter for surveillance of injectable contraceptive: Secondary | ICD-10-CM

## 2019-09-13 LAB — WET PREP FOR TRICH, YEAST, CLUE
Trichomonas Exam: NEGATIVE
Yeast Exam: NEGATIVE

## 2019-09-13 MED ORDER — AZITHROMYCIN 500 MG PO TABS
1000.0000 mg | ORAL_TABLET | Freq: Once | ORAL | Status: AC
  Administered 2019-09-13: 1000 mg via ORAL

## 2019-09-13 MED ORDER — AZITHROMYCIN 500 MG PO TABS: 500.0000 mg | ORAL_TABLET | Freq: Once | ORAL | Status: DC

## 2019-09-13 NOTE — Progress Notes (Signed)
[facility] Department Family Planning Visit- Initial Visit  Subjective:  Julie Harvey is a 29 y.o.  [phone]   being seen today for an initial well woman visit and to discuss family planning options.  She is currently using Depo for pregnancy prevention. Patient reports she does not want a pregnancy in the next year.  Patient has the following medical conditions has Tobacco use and Family history of breast cancer on their problem list.  Chief Complaint  Patient presents with  . Annual Exam    PAP    Patient reports she was recently told by a partner that they had chlamydia. She wants STI testing. She was seen for face to face visit for depo.   Patient denies changes to medical history.    Body mass index is 24.65 kg/m. - Patient is eligible for diabetes screening based on BMI and age >69?  no HA1C ordered? not applicable  Patient reports 1 of partners in last year. Desires STI screening?  Yes  Has patient been screened once for HCV in the past?  No  No results found for: HCVAB  Does the patient have current of drug use, have a partner with drug use, and/or has been incarcerated since last result? No  If yes-- Screen for HCV through [facility]   Does the patient meet criteria for HBV testing? No  Criteria:  -Household, sexual or needle sharing contact with HBV -History of drug use -HIV positive -Those with known Hep C   Health Maintenance Due  Topic Date Due  . COVID-19 Vaccine (1) Never done  . PAP SMEAR-Modifier  09/04/2019    Review of Systems  Constitutional: Negative for chills and fever.  Eyes: Negative for blurred vision and double vision.  Respiratory: Negative for cough and shortness of breath.   Cardiovascular: Negative for chest pain and orthopnea.  Gastrointestinal: Negative for nausea and vomiting.  Genitourinary: Negative for dysuria, flank pain and frequency.  Musculoskeletal: Negative for myalgias.  Skin: Negative for rash.  Neurological:  Negative for dizziness, tingling, weakness and headaches.  Endo/Heme/Allergies: Does not bruise/bleed easily.  Psychiatric/Behavioral: Negative for depression and suicidal ideas. The patient is not nervous/anxious.     The following portions of the patient's history were reviewed and updated as appropriate: allergies, current medications, past family history, past medical history, past social history, past surgical history and problem list. Problem list updated.   See flowsheet for other program required questions.  Objective:   Vitals:   09/13/19 1525  BP: 104/76  Pulse: 88  Weight: 143 lb 9.6 oz (65.1 kg)  Height: 5\' 4"  (1.626 m)    Physical Exam Vitals and nursing note reviewed.  Constitutional:      Appearance: Normal appearance.  HENT:     Head: Normocephalic.  Pulmonary:     Effort: Pulmonary effort is normal.  Genitourinary:    General: Normal vulva.     Exam position: Lithotomy position.     Pubic Area: No rash.      Labia:        Right: No rash or lesion.        Left: No rash or lesion.      Vagina: Vaginal discharge (creamy, white, coats cervix) present.     Cervix: No cervical motion tenderness, discharge, friability, erythema or cervical bleeding.     Uterus: Normal.      Adnexa: Right adnexa normal and left adnexa normal.  Musculoskeletal:  General: Normal range of motion.  Skin:    General: Skin is warm and dry.  Neurological:     Mental Status: She is alert. Mental status is at baseline.       Assessment and Plan:  Julie Harvey is a 29 y.o. female presenting to the [facility] Department for an initial well woman exam/family planning visit  Contraception counseling: Reviewed all forms of birth control options in the tiered based approach. available including abstinence; over the counter/barrier methods; hormonal contraceptive medication including pill, patch, ring, injection,contraceptive implant, ECP; hormonal and nonhormonal IUDs;  permanent sterilization options including vasectomy and the various tubal sterilization modalities. Risks, benefits, and typical effectiveness rates were reviewed.  Questions were answered.  Written information was also given to the patient to review.  Patient desires Depo, this was prescribed for patient. She will follow up in 1  month for surveillance.  She was told to call with any further questions, or with any concerns about this method of contraception.  Emphasized use of condoms 100% of the time for STI prevention. ECP - NA  1. Well woman exam with routine gynecological exam Pap today Does not have time for CBE-- recommended patient return for breast exam. Mother had breast cancer at 78. Discussed breast awareness and need to start mammograms at 40 but likely earlier. Has a PCP at [facility] - Placed RX for depo for 1 year, knows to come in August 2021 for depo.  - Chlamydia/Gonorrhea [facility] - HIV/HCV [facility] - Syphilis Serology, [facility] - WET PREP FOR TRICH, YEAST, CLUE - IGP, rfx Aptima HPV ASCU - azithromycin (ZITHROMAX) tablet 500 mg  2. Screening examination for venereal disease Treat wet mount per standing - Chlamydia/Gonorrhea [facility] - HIV/HCV [facility] - Syphilis Serology, [facility] - WET PREP FOR TRICH, YEAST, CLUE  3. Exposure to chlamydia - azithromycin (ZITHROMAX) tablet 500 mg  4. Tobacco use- counseled on tobacco cessation via 5As.   Return in about 1 month (around 10/13/2019) for Depo.  No future appointments.  Federico Flake, MD

## 2019-09-13 NOTE — Progress Notes (Signed)
Presented to clinic utilizing cell phone and back of health history form incomplete.Per client, has adequate MVI supply until Depo due 10/26/2019. Wet mount reviewed - negative results.. Treated as contact to chlamydia per standing order. Jossie Ng, RN

## 2019-09-14 DIAGNOSIS — Z720 Tobacco use: Secondary | ICD-10-CM | POA: Insufficient documentation

## 2019-09-14 DIAGNOSIS — Z803 Family history of malignant neoplasm of breast: Secondary | ICD-10-CM | POA: Insufficient documentation

## 2019-09-14 MED ORDER — MEDROXYPROGESTERONE ACETATE 150 MG/ML IM SUSP
150.0000 mg | INTRAMUSCULAR | Status: DC
  Administered 2019-12-08 – 2020-04-03 (×2): 150 mg via INTRAMUSCULAR

## 2019-09-20 LAB — HM HIV SCREENING LAB: HM HIV Screening: NEGATIVE

## 2019-09-20 LAB — HM HEPATITIS C SCREENING LAB: HM Hepatitis Screen: NEGATIVE

## 2019-09-21 ENCOUNTER — Encounter: Payer: Self-pay | Admitting: Family Medicine

## 2019-09-22 LAB — IGP, RFX APTIMA HPV ASCU: PAP Smear Comment: 0

## 2019-12-08 ENCOUNTER — Ambulatory Visit (LOCAL_COMMUNITY_HEALTH_CENTER): Payer: Managed Care, Other (non HMO)

## 2019-12-08 ENCOUNTER — Other Ambulatory Visit: Payer: Self-pay

## 2019-12-08 VITALS — BP 92/60 | Ht 64.0 in | Wt 139.5 lb

## 2019-12-08 DIAGNOSIS — Z30013 Encounter for initial prescription of injectable contraceptive: Secondary | ICD-10-CM | POA: Diagnosis not present

## 2019-12-08 DIAGNOSIS — Z3009 Encounter for other general counseling and advice on contraception: Secondary | ICD-10-CM | POA: Diagnosis not present

## 2019-12-08 DIAGNOSIS — Z3042 Encounter for surveillance of injectable contraceptive: Secondary | ICD-10-CM

## 2019-12-08 NOTE — Progress Notes (Signed)
Consulted by RN re:  Patient 17 wk 1 d since last Depo and last sex was 2 weeks ago.  Reviewed RN note and agree that documentation reflects discussion and plan.

## 2019-12-08 NOTE — Progress Notes (Signed)
Pt is 17 weeks 1 day post depo. Reports last sex 2 weeks ago. Consult C. [place], [place] who gives ok for pt to have depo today based on order by K. Alvester Morin, MD dated 09/13/19. Pt advised to use condoms x 2 weeks as back up. DMPA given IM today Right UOQ without problem. Questions answered and reports understanding. Pt advised to adhere to depo recommended schedule. Next depo due 02/23/2020. Jerel Shepherd, RN

## 2020-03-06 ENCOUNTER — Ambulatory Visit: Payer: Managed Care, Other (non HMO)

## 2020-03-12 ENCOUNTER — Ambulatory Visit: Payer: Managed Care, Other (non HMO)

## 2020-04-03 ENCOUNTER — Other Ambulatory Visit: Payer: Self-pay

## 2020-04-03 ENCOUNTER — Ambulatory Visit (LOCAL_COMMUNITY_HEALTH_CENTER): Payer: Managed Care, Other (non HMO) | Admitting: Physician Assistant

## 2020-04-03 ENCOUNTER — Ambulatory Visit: Payer: Managed Care, Other (non HMO)

## 2020-04-03 VITALS — BP 106/70 | Wt 133.8 lb

## 2020-04-03 DIAGNOSIS — Z01419 Encounter for gynecological examination (general) (routine) without abnormal findings: Secondary | ICD-10-CM | POA: Diagnosis not present

## 2020-04-03 DIAGNOSIS — Z3042 Encounter for surveillance of injectable contraceptive: Secondary | ICD-10-CM

## 2020-04-03 DIAGNOSIS — Z3009 Encounter for other general counseling and advice on contraception: Secondary | ICD-10-CM | POA: Diagnosis not present

## 2020-04-04 ENCOUNTER — Encounter: Payer: Self-pay | Admitting: Physician Assistant

## 2020-04-04 NOTE — Progress Notes (Signed)
   [facility] problem [facility]  [facility]  Subjective:  Julie Harvey is a 30 y.o. being seen today for Depo. No chief complaint on file.   HPI  Patient is doing well with the Depo and would like to continue with it.  Per chart review, last RP 08/2019, with order for 1 year for Depo.  Last Depo was 12/08/2019, so is 16 weeks and 5 days since last depo.     Does the patient have a current or past history of drug use? No   No components found for: HCV]   Health Maintenance Due  Topic Date Due  . COVID-19 Vaccine (1) Never done  . INFLUENZA VACCINE  10/23/2019    Review of Systems  All other systems reviewed and are negative.   The following portions of the patient's history were reviewed and updated as appropriate: allergies, current medications, past family history, past medical history, past social history, past surgical history and problem list. Problem list updated.   See flowsheet for other program required questions.  Objective:   Vitals:   04/03/20 1407  BP: 106/70  Weight: 133 lb 12.8 oz (60.7 kg)    Physical Exam Vitals reviewed.  Constitutional:      General: She is not in acute distress.    Appearance: Normal appearance. She is normal weight.  HENT:     Head: Normocephalic and atraumatic.  Eyes:     Conjunctiva/sclera: Conjunctivae normal.  Pulmonary:     Effort: Pulmonary effort is normal.  Skin:    General: Skin is warm and dry.  Neurological:     Mental Status: She is alert and oriented to person, place, and time.  Psychiatric:        Mood and Affect: Mood normal.        Behavior: Behavior normal.        Thought Content: Thought content normal.        Judgment: Judgment normal.       Assessment and Plan:  Julie Harvey is a 30 y.o. female presenting to the [facility] Department for a Women's Health problem visit  1. Encounter for counseling regarding contraception Reviewed with patient normal SE  of Depo and when to call clinic for irregular bleeding. Enc condoms with all sex for STD protection. RTC for next Depo in March/April 2022.  2. Surveillance for Depo-Provera contraception Depo given R GM per 08/2019 order.     Return in about 3 months (around 07/02/2020) for for next Depo and prn.  No future appointments.  Matt Holmes, PA

## 2021-03-28 ENCOUNTER — Other Ambulatory Visit: Payer: Self-pay

## 2021-03-28 ENCOUNTER — Ambulatory Visit
Admission: EM | Admit: 2021-03-28 | Discharge: 2021-03-28 | Disposition: A | Discharge status: Home / Self Care | Payer: Managed Care, Other (non HMO) | Attending: Emergency Medicine | Admitting: Emergency Medicine

## 2021-03-28 DIAGNOSIS — S161XXA Strain of muscle, fascia and tendon at neck level, initial encounter: Secondary | ICD-10-CM | POA: Diagnosis not present

## 2021-03-28 MED ORDER — BACLOFEN 10 MG PO TABS: 10.0000 mg | ORAL_TABLET | Freq: Three times a day (TID) | ORAL | 0 refills | Status: AC

## 2021-03-28 MED ORDER — IBUPROFEN 600 MG PO TABS: 600.0000 mg | ORAL_TABLET | Freq: Four times a day (QID) | ORAL | 0 refills | Status: DC | PRN

## 2021-03-28 NOTE — Discharge Instructions (Signed)
Take the ibuprofen, 600 mg every 6 hours with food, on a schedule for the next 48 hours and then as needed. ? ?Take the baclofen, 10 mg every 8 hours, on a schedule for the next 48 hours and then as needed. ? ?Apply moist heat to your back for 30 minutes at a time 2-3 times a day to improve blood flow to the area and help remove the lactic acid causing the spasm. ? ?Follow the neck exercises given at discharge. ? ?Return for reevaluation for any new or worsening symptoms.  ?
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
## 2022-04-09 ENCOUNTER — Ambulatory Visit: Payer: Managed Care, Other (non HMO)

## 2022-07-22 ENCOUNTER — Encounter: Payer: Self-pay | Admitting: Advanced Practice Midwife

## 2022-07-22 ENCOUNTER — Ambulatory Visit: Payer: Medicaid Other | Admitting: Advanced Practice Midwife

## 2022-07-22 DIAGNOSIS — Z30013 Encounter for initial prescription of injectable contraceptive: Secondary | ICD-10-CM

## 2022-07-22 DIAGNOSIS — R58 Hemorrhage, not elsewhere classified: Secondary | ICD-10-CM | POA: Insufficient documentation

## 2022-07-22 DIAGNOSIS — Z3009 Encounter for other general counseling and advice on contraception: Secondary | ICD-10-CM

## 2022-07-22 DIAGNOSIS — Z309 Encounter for contraceptive management, unspecified: Secondary | ICD-10-CM

## 2022-07-22 DIAGNOSIS — Z3202 Encounter for pregnancy test, result negative: Secondary | ICD-10-CM

## 2022-07-22 DIAGNOSIS — F1290 Cannabis use, unspecified, uncomplicated: Secondary | ICD-10-CM | POA: Insufficient documentation

## 2022-07-22 DIAGNOSIS — F17200 Nicotine dependence, unspecified, uncomplicated: Secondary | ICD-10-CM

## 2022-07-22 LAB — WET PREP FOR TRICH, YEAST, CLUE
Trichomonas Exam: NEGATIVE
Yeast Exam: NEGATIVE

## 2022-07-22 LAB — PREGNANCY, URINE: Preg Test, Ur: NEGATIVE

## 2022-07-22 MED ORDER — MEDROXYPROGESTERONE ACETATE 150 MG/ML IM SUSP
150.0000 mg | INTRAMUSCULAR | Status: AC
  Administered 2022-07-22 – 2023-07-03 (×3): 150 mg via INTRAMUSCULAR

## 2022-07-22 NOTE — Progress Notes (Signed)
Pt here for annual physical and Depo Provera injection.  Wet mount results reviewed with patient.  No treatment needed at this time per standing orders.  Urine pregnancy test today negative.  Depo Provera 150mg  IM given in RUOQ without difficulty.  Pt encouraged to abstain from sex or use condoms as back up x 7 days.  Pt also instructed to repeat pregnancy test at home on 08-02-22 and to contact clinic if positive.  Verbalizes understanding of above.  Family planning packet given.  Condoms declined.  Reminder card given for next Depo injection and for repeat pap smear.-Edelmiro Innocent, RN

## 2022-07-22 NOTE — Progress Notes (Signed)
[facility] [address] Main Number: [phone]    Family Planning Visit- Initial Visit  Subjective:  Julie Harvey is a 32 y.o. SBF smoker [id number] (9, 6, 7 mo)  being seen today for an initial annual visit and to discuss reproductive life planning.  The patient is currently using No Method - Other Reason for pregnancy prevention. Patient reports   does not want a pregnancy in the next year.     report they are looking for a method that provides High efficacy at preventing pregnancy  Patient has the following medical conditions has Tobacco use and Family history of breast cancer on their problem list.  Chief Complaint  Patient presents with   Annual Exam    Physical and Depo Provera    Patient reports here for physical and DMPA. Last DMPA in hospital after emergency LTCS on 12/09/21. IOL for IUGR at 37 2/7 and breech so ECV with bradycardia to 60's so general and emergency LTCS with pp hemorrhage EBL: 890. Not working. Living with her mom and her 3 kids. LMP spotting x 5 wks. Last sex 07/19/22 without condom; with current partner x 10 years; 1 partner in last 3 mo. Last dental exam 2022. Smoking 1-4 cpd. Last MJ yesterday. Last ETOH 2023. Last PE at [facility]. Last pap 09/13/19 neg.   Patient denies vaping, cigars  There is no height or weight on file to calculate BMI. - Patient is eligible for diabetes screening based on BMI> 29 and age >35?  not applicable HA1C ordered? not applicable  Patient reports 1  partner/s in last year. Desires STI screening?  No - refusing blood work  Has patient been screened once for HCV in the past?  Yes  No results found for: "HCVAB"  Does the patient have current drug use (including MJ), have a partner with drug use, and/or has been incarcerated since last result? Yes  If yes-- Screen for HCV through [facility]   Does the patient meet criteria for HBV testing? No  Criteria:   -Household, sexual or needle sharing contact with HBV -History of drug use -HIV positive -Those with known Hep C   Health Maintenance Due  Topic Date Due   COVID-19 Vaccine (1) Never done   PAP SMEAR-Modifier  09/13/2022    Review of Systems  All other systems reviewed and are negative.   The following portions of the patient's history were reviewed and updated as appropriate: allergies, current medications, past family history, past medical history, past social history, past surgical history and problem list. Problem list updated.   See flowsheet for other program required questions.  Objective:  There were no vitals filed for this visit.  Physical Exam Constitutional:      Appearance: Normal appearance. She is normal weight.  HENT:     Head: Normocephalic and atraumatic.     Comments: Last dental exam 2022    Mouth/Throat:     Mouth: Mucous membranes are moist.  Eyes:     Conjunctiva/sclera: Conjunctivae normal.  Neck:     Thyroid: No thyroid mass, thyromegaly or thyroid tenderness.  Cardiovascular:     Rate and Rhythm: Normal rate and regular rhythm.  Pulmonary:     Effort: Pulmonary effort is normal.     Breath sounds: Normal breath sounds.  Abdominal:     Palpations: Abdomen is soft.     Comments: Soft without masses or tenderness  Genitourinary:  General: Normal vulva.     Exam position: Lithotomy position.     Vagina: Bleeding (light red blood from menses, ph<4.5) present.     Cervix: Normal.     Uterus: Normal.      Adnexa: Right adnexa normal and left adnexa normal.     Rectum: Normal.  Musculoskeletal:        General: Normal range of motion.     Cervical back: Normal range of motion and neck supple.  Skin:    General: Skin is warm and dry.  Neurological:     Mental Status: She is alert.  Psychiatric:        Mood and Affect: Mood normal.       Assessment and Plan:  Julie Harvey is a 32 y.o. female presenting to the [facility]  Department for an initial annual wellness/contraceptive visit  Contraception counseling: Reviewed options based on patient desire and reproductive life plan. Patient is interested in Hormonal Injection. This was provided to the patient today.  if not why not clearly documented  Risks, benefits, and typical effectiveness rates were reviewed.  Questions were answered.  Written information was also given to the patient to review.    The patient will follow up in  11-13 weeks for surveillance.  The patient was told to call with any further questions, or with any concerns about this method of contraception.  Emphasized use of condoms 100% of the time for STI prevention.  Need for ECP was assessed. Patient reported Unprotected sex within past 120 hours.  Reviewed options and patient desired No method of ECP, declined all    1. Family planning Treat wet mount per standing orders Immunization nurse consult Pt declines Plan B Needs pap 08/2022  - WET PREP FOR TRICH, YEAST, CLUE - Pregnancy, urine - Chlamydia/Gonorrhea [place] [facility]  2. Encounter for initial prescription of injectable contraceptive If PT neg today may have DMPA 150 mg IM q 11-13 wks x 1 year Please counsel on need for abstinance next 7 days Please counsel pt on need for PT 08/02/22 and to call if +     Return for 11-13 wk DMPA.  No future appointments.  Alberteen Spindle, CNM

## 2022-10-27 ENCOUNTER — Ambulatory Visit: Payer: Medicaid Other

## 2022-11-03 ENCOUNTER — Ambulatory Visit (LOCAL_COMMUNITY_HEALTH_CENTER): Payer: Medicaid Other

## 2022-11-03 VITALS — BP 102/76 | Ht 64.0 in | Wt 134.5 lb

## 2022-11-03 DIAGNOSIS — Z3042 Encounter for surveillance of injectable contraceptive: Secondary | ICD-10-CM | POA: Diagnosis not present

## 2022-11-03 DIAGNOSIS — Z30013 Encounter for initial prescription of injectable contraceptive: Secondary | ICD-10-CM

## 2022-11-03 DIAGNOSIS — Z3009 Encounter for other general counseling and advice on contraception: Secondary | ICD-10-CM

## 2022-11-03 NOTE — Progress Notes (Signed)
14 weeks 6 days post hormonal injection.  Patient stated no concerns or issues with current birth control method. Medroxyprogesterone Acetate 150 mg given per order dated 07/22/2022 by Hazle Coca, CNM. Given IM LUOQ. Tolerated well. Reminder card provided to call for next injection due 01/19/2023.

## 2023-03-10 ENCOUNTER — Ambulatory Visit: Payer: Medicaid Other

## 2023-03-10 ENCOUNTER — Encounter: Payer: Self-pay | Admitting: Advanced Practice Midwife

## 2023-03-10 VITALS — BP 100/69 | HR 109 | Temp 97.3°F | Wt 140.8 lb

## 2023-03-10 DIAGNOSIS — Z30013 Encounter for initial prescription of injectable contraceptive: Secondary | ICD-10-CM

## 2023-03-10 DIAGNOSIS — Z309 Encounter for contraceptive management, unspecified: Secondary | ICD-10-CM | POA: Diagnosis not present

## 2023-03-10 DIAGNOSIS — Z3009 Encounter for other general counseling and advice on contraception: Secondary | ICD-10-CM

## 2023-03-10 DIAGNOSIS — Z3202 Encounter for pregnancy test, result negative: Secondary | ICD-10-CM | POA: Diagnosis not present

## 2023-03-10 DIAGNOSIS — Z3042 Encounter for surveillance of injectable contraceptive: Secondary | ICD-10-CM

## 2023-03-10 DIAGNOSIS — Z720 Tobacco use: Secondary | ICD-10-CM | POA: Diagnosis not present

## 2023-03-10 LAB — PREGNANCY, URINE: Preg Test, Ur: NEGATIVE

## 2023-03-10 MED ORDER — MEDROXYPROGESTERONE ACETATE 150 MG/ML IM SUSP
150.0000 mg | Freq: Once | INTRAMUSCULAR | Status: AC
  Administered 2023-03-10: 150 mg via INTRAMUSCULAR

## 2023-03-10 NOTE — Progress Notes (Unsigned)
In house pregnancy test negative, provider aware and Depo given. Tolerated injection well. Patient aware that PAP is overdue and will need at next visit along with a repeat pregnancy test. Patient stated here for Depo only since she was late for work. Aware of Depo dosing interval and no sexual intercourse for next 7 days. Condoms given.  BTHIELE N

## 2023-03-10 NOTE — Progress Notes (Unsigned)
[facility] [address] Main Number: [phone]  Contraception/Family Planning VISIT ENCOUNTER NOTE  Subjective:   Julie Harvey is a 32 y.o.SBF smoker [id number] female here for reproductive life counseling. The patient is currently using Female Condom to prevent pregnancy.   Pt here for DMPA which was due to be given 01/19/23 (18 1/7). Last sex 02/24/23 with condom; with current partner x 7 years. LMP spotting. Smoking 3-5 cpd. Last pap 09/13/19 neg. Last PE 07/22/22.  Here with infant The patient does not want a pregnancy in the next year.    Client states they are looking for the following:  High efficacy at preventing pregnancy  Denies abnormal vaginal bleeding, discharge, pelvic pain, problems with intercourse or other gynecologic concerns.    Gynecologic History No LMP recorded.  Health Maintenance Due  Topic Date Due   Cervical Cancer Screening (HPV/Pap Cotest)  09/13/2022   INFLUENZA VACCINE  10/23/2022   COVID-19 Vaccine (1 - 2024-25 season) Never done     The following portions of the patient's history were reviewed and updated as appropriate: allergies, current medications, past family history, past medical history, past social history, past surgical history and problem list.  Review of Systems Pertinent items are noted in HPI.   Objective:  BP 100/69 (Cuff Size: Normal)   Pulse (!) 109   Temp (!) 97.3 F (36.3 C)   Wt 140 lb 12.8 oz (63.9 kg)   BMI 24.17 kg/m  Gen: well appearing, NAD HEENT: no scleral icterus CV: RR Lung: Normal WOB Ext: warm well perfused     Assessment and Plan:   Need for emergency contraceptive care was assessed today.  Last unprotected sex was:  02/24/23 with condom   Patient reported > 120 hours .  Reviewed options and patient desired No method of ECP, declined all    Contraception counseling: Reviewed methods in a patient centered fashion and used shared decision making  with the patient. Utilized [employer]stream patient education tools as appropriate. The patient stated there goals and desires from a method are: High efficacy at preventing pregnancy  We reviewed the following methods in detail based on patient preferences available included: Hormonal Injection  Patient expressed they would like Hormonal Injection  This was provided to the patient today.  if not why not clearly documented  Risks, benefits, and typical effectiveness rates were reviewed.  Questions were answered.  Written information was also given to the patient to review.       will follow up in  11-13 weeks for surveillance.   was told to call with any further questions, or with any concerns about this method of contraception or cycle control.  Emphasized use of condoms 100% of the time for STI prevention.    - Pregnancy, urine  2. Family planning Will need pap at next apt (pt states she is late for work and in a hurry) Will need physical 06/2022   3. Encounter for initial prescription of injectable contraceptive If PT neg today may have DMPA 150 mg IM q 11-13 wks  Pt counseled on abstinance next 7 days    Please refer to After Visit Summary for other counseling recommendations.   Return for 11-13 wk DMPA.  Alberteen Spindle, CNM [facility] DEPARTMENT

## 2023-07-03 ENCOUNTER — Ambulatory Visit

## 2023-07-03 VITALS — BP 114/59 | Ht 64.0 in | Wt 135.5 lb

## 2023-07-03 DIAGNOSIS — Z30013 Encounter for initial prescription of injectable contraceptive: Secondary | ICD-10-CM

## 2023-07-03 DIAGNOSIS — Z3042 Encounter for surveillance of injectable contraceptive: Secondary | ICD-10-CM

## 2023-07-03 DIAGNOSIS — Z309 Encounter for contraceptive management, unspecified: Secondary | ICD-10-CM

## 2023-07-03 DIAGNOSIS — Z3009 Encounter for other general counseling and advice on contraception: Secondary | ICD-10-CM

## 2023-07-03 NOTE — Progress Notes (Signed)
 Client 16 weeks and 3 days post depo. Voices no concerns.  Client reports that she has been having spotting since 06/23/2023, but no concerns. Depo administered per order in RUOQ per order by E. Sciora dated 07/22/2023.  Client counseled to used back up method for 7 days, since she is closed to the end of the 16 week window. Client counseled to try to schedule appointments 11-13 weeks between doses.   Client due for next PE 07/23/2023 and next depo 09/18/2023. Client given reminder card.

## 2023-10-20 ENCOUNTER — Ambulatory Visit

## 2023-10-27 ENCOUNTER — Ambulatory Visit

## 2023-12-08 ENCOUNTER — Ambulatory Visit

## 2023-12-08 VITALS — BP 104/65 | HR 77 | Ht 64.0 in | Wt 129.4 lb

## 2023-12-08 DIAGNOSIS — Z3202 Encounter for pregnancy test, result negative: Secondary | ICD-10-CM | POA: Diagnosis not present

## 2023-12-08 DIAGNOSIS — Z30013 Encounter for initial prescription of injectable contraceptive: Secondary | ICD-10-CM | POA: Diagnosis not present

## 2023-12-08 DIAGNOSIS — Z3042 Encounter for surveillance of injectable contraceptive: Secondary | ICD-10-CM

## 2023-12-08 DIAGNOSIS — Z309 Encounter for contraceptive management, unspecified: Secondary | ICD-10-CM | POA: Diagnosis not present

## 2023-12-08 LAB — PREGNANCY, URINE: Preg Test, Ur: NEGATIVE

## 2023-12-08 MED ORDER — MEDROXYPROGESTERONE ACETATE 150 MG/ML IM SUSP
150.0000 mg | INTRAMUSCULAR | Status: AC
  Administered 2023-12-08: 150 mg via INTRAMUSCULAR

## 2023-12-08 NOTE — Progress Notes (Signed)
 Pt here for physical exam, pap, STI screening and Depo Provera  injection.  Urine pregnancy test negative.  Pt unable to stay for complete physical so given Depo and to return at a later date to complete physical.  Depo Provera  150mg  IM given in LUOQ without complications by K. Oppongo, RN.  Condoms declined.  Reminder card given.-Aideliz Garmany, RN

## 2023-12-08 NOTE — Progress Notes (Signed)
 [facility] Problem Visit  [facility]  Subjective:  Julie Harvey is a 33 y.o. being seen today for depo initiation.  Chief Complaint  Patient presents with   Annual Exam    Physical and restart Depo   HPI: Patient originally came for a full physical and Depo initiation, pap and STI screening. However, problem with her son at school is requiring her to leave clinic early. Plan for urine pregnancy test followed by Depo, and she will return for PE and STI testing. She has no vaginal concerns today.  Health Maintenance Due  Topic Date Due   Pneumococcal Vaccine (1 of 2 - PCV) Never done   Hepatitis B Vaccines 19-59 Average Risk (1 of 3 - 19+ 3-dose series) Never done   HPV VACCINES (1 - 3-dose SCDM series) Never done   Cervical Cancer Screening (HPV/Pap Cotest)  09/13/2022   Influenza Vaccine  10/23/2023   COVID-19 Vaccine (1 - 2024-25 season) Never done   Objective:   Vitals:   12/08/23 1359  BP: 104/65  Pulse: 77  Weight: 129 lb 6.4 oz (58.7 kg)  Height: 5' 4 (1.626 m)   Physical Exam Constitutional:      Appearance: Normal appearance.  HENT:     Head: Normocephalic.     Mouth/Throat:     Mouth: Mucous membranes are moist.  Eyes:     General: No scleral icterus.       Right eye: No discharge.        Left eye: No discharge.  Pulmonary:     Effort: Pulmonary effort is normal.  Skin:    General: Skin is warm and dry.  Neurological:     General: No focal deficit present.     Mental Status: She is alert.  Psychiatric:        Mood and Affect: Mood normal.        Behavior: Behavior normal.    Assessment and Plan:  Julie Harvey is a 33 y.o. female presenting to the [facility] Department for a Women's Health visit  1. Depo-Provera  contraceptive status (Primary)  - Pregnancy, urine - medroxyPROGESTERone  (DEPO-PROVERA ) injection 150 mg  Return when able for full physical exam, pap, and STI  testing..  No future appointments.  Damien FORBES Satchel, NP

## 2024-03-25 ENCOUNTER — Telehealth: Payer: Self-pay

## 2024-03-25 NOTE — Telephone Encounter (Signed)
 Phone call to patient to inform her of need to complete physical exam. Patient scheduled for [facility] for PE and depo on 03/28/2024, arrival 10:45 am (per MARLA Novak, RN). Patient verbalizes agreement.   Per Epic, patient left early at her appt on 12/08/2023 and did not complete PE. She did receive depo on that day. Rockney Grenz, RN

## 2024-03-28 ENCOUNTER — Ambulatory Visit: Payer: Self-pay

## 2024-03-28 ENCOUNTER — Ambulatory Visit: Payer: Self-pay | Admitting: Family Medicine

## 2024-03-28 ENCOUNTER — Encounter: Payer: Self-pay | Admitting: Family Medicine

## 2024-03-28 VITALS — BP 102/64 | HR 84 | Ht 64.0 in | Wt 129.4 lb

## 2024-03-28 DIAGNOSIS — Z30013 Encounter for initial prescription of injectable contraceptive: Secondary | ICD-10-CM

## 2024-03-28 DIAGNOSIS — Z3009 Encounter for other general counseling and advice on contraception: Secondary | ICD-10-CM

## 2024-03-28 DIAGNOSIS — Z113 Encounter for screening for infections with a predominantly sexual mode of transmission: Secondary | ICD-10-CM

## 2024-03-28 DIAGNOSIS — Z01419 Encounter for gynecological examination (general) (routine) without abnormal findings: Secondary | ICD-10-CM

## 2024-03-28 LAB — WET PREP FOR TRICH, YEAST, CLUE
Clue Cell Exam: NEGATIVE
Trichomonas Exam: NEGATIVE
Yeast Exam: NEGATIVE

## 2024-03-28 LAB — HM HIV SCREENING LAB: HM HIV Screening: NEGATIVE

## 2024-03-28 MED ORDER — MEDROXYPROGESTERONE ACETATE 150 MG/ML IM SUSP
150.0000 mg | INTRAMUSCULAR | Status: AC
  Administered 2024-03-28: 150 mg via INTRAMUSCULAR

## 2024-03-28 NOTE — Progress Notes (Signed)
 " [facility] [address], [address] [place] [address] Main phone: [phone]  Family Planning Visit - Repeat Yearly Visit  Subjective:  Julie Harvey is a 34 y.o. [id number]  being seen today for an annual wellness visit and to discuss contraception options. The patient is currently using hormonal injection for pregnancy prevention. Patient does not want a pregnancy in the next year.   Patient reports they are looking for a method with the following characteristics:  High efficacy at preventing pregnancy Minimal bleeding or improved bleeding profile  Patient has the following medical problems:  Patient Active Problem List   Diagnosis Date Noted   Marijuana use 07/22/2022   Hemorrhage pp EBL: 890 07/22/2022   Tobacco use 09/14/2019   Family history of breast cancer 09/14/2019   Chief Complaint  Patient presents with   Annual Exam    Completion of physical and Depo    HPI Patient reports to clinic for yearly PE and depo. Agrees to pap today. Reports she is trying to quit smoking for the new year.   Patient denies concerns about self .    Review of Systems  Constitutional:  Negative for weight loss.  Eyes:  Negative for blurred vision.  Respiratory:  Negative for cough and shortness of breath.   Cardiovascular:  Negative for claudication.  Gastrointestinal:  Negative for nausea.  Genitourinary:  Negative for dysuria and frequency.  Skin:  Negative for rash.  Neurological:  Negative for headaches.  Endo/Heme/Allergies:  Does not bruise/bleed easily.    See flowsheet for further details and programmatic requirements Hyperlink available at the top of the signed note in blue.  Flow sheet content below:  Pregnancy Intention Screening Does the patient want to become pregnant in the next year?: No Does the patient's partner want to become pregnant in the next year?: N/A Would the patient like to discuss  contraceptive options today?: No Other:  Password: [email or web address]  Diabetes screening This patient is 34 y.o. with a BMI of Body mass index is 22.21 kg/m.SABRA  Is patient eligible for diabetes screening (age >35 and BMI >25)?  no  Was Hgb A1c ordered? not applicable  STI screening Patient reports 1 of partners in last year.  Does this patient desire STI screening?  Yes  Hepatitis C screening Has patient been screened once for HCV in the past?  Yes  No results found for: HCVAB  Does the patient meet criteria for HCV testing? No  (If yes-- Screen for HCV through [facility]) Criteria:  Since the last HCV result, does the patient have any of the following? - Current drug use - Have a partner with drug use - Has been incarcerated  Hepatitis B screening Does the patient meet criteria for HBV testing? No Criteria:  -Household, sexual or needle sharing contact with HBV -History of drug use -HIV positive -Those with known Hep C  Cervical Cancer Screening  Result Date Procedure Results Follow-ups  09/13/2019 IGP, rfx Aptima HPV ASCU DIAGNOSIS:: Comment Specimen adequacy:: Comment Clinician Provided ICD10: Comment Performed by:: Comment QC reviewed by:: Comment Electronically signed by:: Comment PAP Smear Comment: . Note:: Comment Test Methodology: CANCELED PAP Reflex: Comment   09/03/2016 HM PAP SMEAR HM Pap smear: neg/HPV negative   09/03/2016 Pap IG and HPV (high risk) DNA detection    05/08/2015 HM PAP SMEAR HM Pap smear: ASCUS/HPV positive   05/08/2015 Pap IG and HPV (high risk) DNA detection  11/04/2013 HM PAP SMEAR HM Pap smear: LSIL   11/04/2013 Pap IG (Image Guided)      Health Maintenance Due  Topic Date Due   Pneumococcal Vaccine (1 of 2 - PCV) Never done   Hepatitis B Vaccines 19-59 Average Risk (1 of 3 - 19+ 3-dose series) Never done   HPV VACCINES (1 - 3-dose SCDM series) Never done   Cervical Cancer Screening (HPV/Pap Cotest)  09/13/2022   Influenza Vaccine   10/23/2023   COVID-19 Vaccine (1 - 2025-26 season) Never done    The following portions of the patient's history were reviewed and updated as appropriate: allergies, current medications, past family history, past medical history, past social history, past surgical history and problem list. Problem list updated.  Objective:   Vitals:   03/28/24 1123  BP: 102/64  Pulse: 84  Weight: 129 lb 6.4 oz (58.7 kg)  Height: 5' 4 (1.626 m)    Physical Exam Vitals and nursing note reviewed. Exam conducted with a chaperone present Julie Orange CNA).  Constitutional:      Appearance: Normal appearance.  HENT:     Head: Normocephalic and atraumatic.     Comments: No nits or hair loss of scalp, brows, and lashes    Mouth/Throat:     Mouth: Mucous membranes are moist.     Pharynx: Oropharynx is clear. No oropharyngeal exudate or posterior oropharyngeal erythema.  Eyes:     General:        Right eye: No discharge.        Left eye: No discharge.     Conjunctiva/sclera: Conjunctivae normal.     Right eye: Right conjunctiva is not injected.     Left eye: Left conjunctiva is not injected.  Pulmonary:     Effort: Pulmonary effort is normal.  Abdominal:     Tenderness: There is no abdominal tenderness. There is no rebound.  Genitourinary:    General: Normal vulva.     Exam position: Lithotomy position.     Pubic Area: No rash or pubic lice.      Labia:        Right: No rash or lesion.        Left: No rash or lesion.      Vagina: Normal. No vaginal discharge, erythema or lesions.     Cervix: Eversion present. No cervical motion tenderness, discharge, lesion or erythema.     Uterus: Not enlarged and not tender.      Rectum: Normal.        Comments: pH = 4 Lymphadenopathy:     Cervical: No cervical adenopathy.     Upper Body:     Right upper body: No supraclavicular, axillary or epitrochlear adenopathy.     Left upper body: No supraclavicular, axillary or epitrochlear adenopathy.      Lower Body: No right inguinal adenopathy. No left inguinal adenopathy.  Skin:    General: Skin is warm and dry.     Findings: No lesion or rash.  Neurological:     Mental Status: She is alert and oriented to person, place, and time.     Assessment and Plan:  Julie Harvey is a 34 y.o. female [phone] presenting to the [facility] Department for an yearly wellness and contraception visit  1. Well woman exam with routine gynecological exam (Primary) -CBE deferred -reports she is trying to quit smoking- states this is her stress relief -encouraged other types of stress relief and hobbies- discussed reading -given quit  line information  - IGP, Aptima HPV  2. Family planning Contraception counseling:  Reviewed options based on patient desire and reproductive life plan. Patient is interested in Hormonal Injection. This was provided to the patient today.   Risks, benefits, and typical effectiveness rates were reviewed.  Questions were answered.  Written information was also given to the patient to review.    The patient will follow up in  3 months for surveillance.  The patient was told to call with any further questions, or with any concerns about this method of contraception.  Emphasized use of condoms 100% of the time for STI prevention.  Emergency Contraception Precautions (ECP): Patient assessed for need of ECP. She is not a candidate based on depo provera  within recommended dates.  Educated on ECP and reviewed options.  Patient desires no method - patient politely declines any emergency contraception.   - medroxyPROGESTERone  (DEPO-PROVERA ) injection 150 mg  3. Screening for venereal disease -asymptomatic  - WET PREP FOR TRICH, YEAST, CLUE - Chlamydia/Gonorrhea [facility] - HIV [facility] - Syphilis Serology, [facility]   No follow-ups on file.  No future appointments.  Verneta Bers, [place] "

## 2024-03-28 NOTE — Progress Notes (Signed)
 Pt here to complete annual physical and to get Depo Provera  injection.  Consents updated at visit 12-08-23 and no changes to patient's medical or socio-emotional history since 12-08-23 as per patient verification.  Wet mount results reviewed with patient.  No treatment needed at this time.  Depo Provera  150mg  IM given in RUOQ without difficulty.  Depo reminder card provided.  Condoms declined.-Rhythm Gubbels, RN

## 2024-03-31 ENCOUNTER — Ambulatory Visit: Payer: Self-pay | Admitting: Family Medicine

## 2024-03-31 LAB — IGP, APTIMA HPV
HPV Aptima: NEGATIVE
PAP Smear Comment: 0

## 2024-03-31 NOTE — Progress Notes (Signed)
 Pt notified via mychart of results- message read. Larraine JONELLE Novak, RN
